# Patient Record
Sex: Female | Born: 1960 | Race: White | Hispanic: No | Marital: Married | Smoking: Never smoker
Health system: Southern US, Community
[De-identification: ages and names within clinical notes are randomized; demographics above are authoritative.]

## PROBLEM LIST (undated history)

## (undated) DIAGNOSIS — I890 Lymphedema, not elsewhere classified: Secondary | ICD-10-CM

## (undated) DIAGNOSIS — I517 Cardiomegaly: Secondary | ICD-10-CM

## (undated) DIAGNOSIS — I10 Essential (primary) hypertension: Secondary | ICD-10-CM

## (undated) DIAGNOSIS — R202 Paresthesia of skin: Secondary | ICD-10-CM

## (undated) DIAGNOSIS — E785 Hyperlipidemia, unspecified: Secondary | ICD-10-CM

## (undated) DIAGNOSIS — I5032 Chronic diastolic (congestive) heart failure: Secondary | ICD-10-CM

## (undated) DIAGNOSIS — M1990 Unspecified osteoarthritis, unspecified site: Secondary | ICD-10-CM

## (undated) DIAGNOSIS — Z87442 Personal history of urinary calculi: Secondary | ICD-10-CM

## (undated) DIAGNOSIS — I272 Pulmonary hypertension, unspecified: Secondary | ICD-10-CM

## (undated) DIAGNOSIS — G4733 Obstructive sleep apnea (adult) (pediatric): Secondary | ICD-10-CM

## (undated) DIAGNOSIS — E039 Hypothyroidism, unspecified: Secondary | ICD-10-CM

## (undated) HISTORY — DX: Pulmonary hypertension, unspecified: I27.20

## (undated) HISTORY — PX: BREAST SURGERY: SHX581

## (undated) HISTORY — DX: Obstructive sleep apnea (adult) (pediatric): G47.33

## (undated) HISTORY — DX: Hyperlipidemia, unspecified: E78.5

## (undated) HISTORY — DX: Cardiomegaly: I51.7

## (undated) HISTORY — DX: Chronic diastolic (congestive) heart failure: I50.32

## (undated) HISTORY — PX: OTHER SURGICAL HISTORY: SHX169

## (undated) HISTORY — DX: Paresthesia of skin: R20.2

## (undated) HISTORY — DX: Unspecified osteoarthritis, unspecified site: M19.90

---

## 1982-12-29 HISTORY — PX: TONSILLECTOMY: SUR1361

## 1997-12-29 HISTORY — PX: BACK SURGERY: SHX140

## 1998-10-01 ENCOUNTER — Encounter: Payer: Self-pay | Admitting: Neurosurgery

## 1998-10-01 ENCOUNTER — Inpatient Hospital Stay (HOSPITAL_COMMUNITY): Admission: RE | Admit: 1998-10-01 | Discharge: 1998-10-08 | Payer: Self-pay | Admitting: Neurosurgery

## 1998-11-05 ENCOUNTER — Inpatient Hospital Stay (HOSPITAL_COMMUNITY): Admission: AD | Admit: 1998-11-05 | Discharge: 1998-11-09 | Payer: Self-pay | Admitting: Neurosurgery

## 1998-11-05 ENCOUNTER — Encounter: Payer: Self-pay | Admitting: Neurosurgery

## 1998-11-14 ENCOUNTER — Ambulatory Visit (HOSPITAL_COMMUNITY): Admission: RE | Admit: 1998-11-14 | Discharge: 1998-11-14 | Payer: Self-pay | Admitting: Neurosurgery

## 1998-11-14 ENCOUNTER — Encounter: Payer: Self-pay | Admitting: Neurosurgery

## 1998-11-26 ENCOUNTER — Ambulatory Visit (HOSPITAL_COMMUNITY): Admission: RE | Admit: 1998-11-26 | Discharge: 1998-11-26 | Payer: Self-pay | Admitting: Neurosurgery

## 2007-12-30 HISTORY — PX: OTHER SURGICAL HISTORY: SHX169

## 2008-06-12 ENCOUNTER — Ambulatory Visit (HOSPITAL_COMMUNITY): Admission: RE | Admit: 2008-06-12 | Discharge: 2008-06-12 | Payer: Self-pay | Admitting: Surgery

## 2008-06-12 ENCOUNTER — Encounter: Admission: RE | Admit: 2008-06-12 | Discharge: 2008-07-31 | Payer: Self-pay | Admitting: Surgery

## 2008-06-13 ENCOUNTER — Ambulatory Visit (HOSPITAL_COMMUNITY): Admission: RE | Admit: 2008-06-13 | Discharge: 2008-06-13 | Payer: Self-pay | Admitting: Surgery

## 2008-08-28 ENCOUNTER — Ambulatory Visit (HOSPITAL_COMMUNITY): Admission: RE | Admit: 2008-08-28 | Discharge: 2008-08-29 | Payer: Self-pay | Admitting: Surgery

## 2008-09-08 ENCOUNTER — Encounter: Admission: RE | Admit: 2008-09-08 | Discharge: 2008-10-30 | Payer: Self-pay | Admitting: Surgery

## 2008-11-02 ENCOUNTER — Encounter: Admission: RE | Admit: 2008-11-02 | Discharge: 2008-11-02 | Payer: Self-pay | Admitting: Surgery

## 2009-01-17 ENCOUNTER — Encounter: Admission: RE | Admit: 2009-01-17 | Discharge: 2009-04-17 | Payer: Self-pay | Admitting: Surgery

## 2009-12-29 HISTORY — PX: DILATION AND CURETTAGE OF UTERUS: SHX78

## 2011-05-13 NOTE — Op Note (Signed)
Jocelyn Morris, Jocelyn Morris               ACCOUNT NO.:  [id number]   MEDICAL RECORD NO.:  [id number]          PATIENT TYPE:  OIB   LOCATION:  1535                         FACILITY:  [facility]   PHYSICIAN:  Thornton Park. Daphine Deutscher, MD  DATE OF BIRTH:  Feb 24, 1961   DATE OF PROCEDURE:  08/28/2008  DATE OF DISCHARGE:                               OPERATIVE REPORT   Monday, August 28, 2008   PREOPERATIVE DIAGNOSIS:  Morbid obesity, body mass index 59.   POSTOPERATIVE DIAGNOSIS:  Morbid obesity, no hiatal hernia seen.   PROCEDURE:  Laparoscopic adjustable gastric band (Allergan APL system).   SURGEON:  Luretha Murphy, MD.   ASSISTANT:  Baruch Merl, MD.   ANESTHESIA:  General endotracheal.   DESCRIPTION OF PROCEDURE:  Jocelyn Morris is a 50 year old white female  taken to Room 1 on Monday morning, August 28, 2008, and given general  anesthesia.  The abdomen was prepped with Techni-Care and draped  sterilely.  Access to the abdomen was achieved through the left upper  quadrant using an Optiview technique without difficulty.  Once the  abdomen was insufflated, the standard trocar placements were used,  including a 15 in the upper port on the right and an 11 down from that.  A 5 mm was placed superiorly through which a liver retractor was used.  The Optiview port in the left upper quadrant was used on the left.  Once  retracted, we dissected a place over on the left side for the finger to  come through and then went over on the right side and found a spot  beside the crus.  The banded finger was inserted from the lower port on  the right and came up through the fatty tissue on the left side.  Previously, the irrigation tubing had been inserted, the balloon had  been blown up and it did not show any evidence of a significant hiatal  hernia as it was trapped in the abdomen, it did not go up into the  chest.  I, therefore, did not disturb the diaphragm.  The band was  passed and engaged in the buckle and  then with tubing in place snapped  down.  It was then pulled over and I plicated it with four sutures.  There was one little serosal tear on the cardia that I went ahead and  repaired with a horizontal mattress suture using the Endo stitch and  Vicryl and then plicated the stomach over the band.  Appeared to be in  good position.  The tubing was brought through the lower incision on the  right side and I decided to go ahead and create a small subcutaneous  pocket because it was so deep down to her  fascia and I thought that would facilitate access.  Such a site was  created and then sewn around it with #4-0 Vicryl subcutaneously and then  subcuticularly, Benzoin and Steri-Strips on the skin.  The patient was  awakened and taken to the recovery room in satisfactory condition.      Thornton Park Daphine Deutscher, MD  Electronically Signed  MBM/MEDQ  D:  08/28/2008  T:  08/28/2008  Job:  [id number]   cc:   [facility]   Dr. Murray Hodgkins, [place]

## 2011-10-01 LAB — DIFFERENTIAL
Lymphocytes Relative: 22
Lymphs Abs: 2.3
Neutrophils Relative %: 72

## 2011-10-01 LAB — CBC
Platelets: 300
WBC: 10.6 — ABNORMAL HIGH

## 2012-03-02 ENCOUNTER — Telehealth (INDEPENDENT_AMBULATORY_CARE_PROVIDER_SITE_OTHER): Payer: Self-pay | Admitting: Surgery

## 2012-03-02 NOTE — Telephone Encounter (Signed)
03/02/12 mailed recall letter for bariatric surgery follow-up. Advised the patient to call [facility] @ [phone] to schedule an appointment...cef °

## 2013-04-07 ENCOUNTER — Telehealth (INDEPENDENT_AMBULATORY_CARE_PROVIDER_SITE_OTHER): Payer: Self-pay | Admitting: Surgery

## 2013-04-07 NOTE — Telephone Encounter (Signed)
04/07/13 lm and mailed recall letter for pt to schedule bariatric follow-up appt. Dr. Martin did lap band surgery 08/28/08. (lss) ° °

## 2013-04-26 ENCOUNTER — Other Ambulatory Visit: Payer: Self-pay | Admitting: Orthopedic Surgery

## 2013-04-26 ENCOUNTER — Encounter (HOSPITAL_COMMUNITY): Payer: Self-pay | Admitting: Pharmacy Technician

## 2013-04-26 MED ORDER — DEXAMETHASONE SODIUM PHOSPHATE 10 MG/ML IJ SOLN: 10.0000 mg | Freq: Once | INTRAMUSCULAR | Status: DC

## 2013-04-26 MED ORDER — BUPIVACAINE LIPOSOME 1.3 % IJ SUSP: 20.0000 mL | Freq: Once | INTRAMUSCULAR | Status: DC

## 2013-04-26 NOTE — Progress Notes (Signed)
Preoperative surgical orders have been place into the Epic hospital system for Ionia Schey on 04/26/2013, 5:28 PM  by Patrica Duel for surgery on 05/09/2013.  Preop Total Knee orders including Experal, IV Tylenol, and IV Decadron as long as there are no contraindications to the above medications. Avel Peace, PA-C

## 2013-04-26 NOTE — Progress Notes (Signed)
NEED PRE OP ORDERS PLEASE-  appt 04/29/13 PST  Thank you

## 2013-04-29 ENCOUNTER — Encounter (HOSPITAL_COMMUNITY): Payer: Self-pay

## 2013-04-29 ENCOUNTER — Encounter (HOSPITAL_COMMUNITY)
Admission: RE | Admit: 2013-04-29 | Discharge: 2013-04-29 | Disposition: A | Discharge status: Home / Self Care | Payer: BC Managed Care – PPO | Source: Ambulatory Visit | Attending: Orthopedic Surgery | Admitting: Orthopedic Surgery

## 2013-04-29 DIAGNOSIS — Z01812 Encounter for preprocedural laboratory examination: Secondary | ICD-10-CM | POA: Insufficient documentation

## 2013-04-29 DIAGNOSIS — Z0183 Encounter for blood typing: Secondary | ICD-10-CM | POA: Insufficient documentation

## 2013-04-29 DIAGNOSIS — M1710 Unilateral primary osteoarthritis, unspecified knee: Secondary | ICD-10-CM | POA: Insufficient documentation

## 2013-04-29 DIAGNOSIS — Z01810 Encounter for preprocedural cardiovascular examination: Secondary | ICD-10-CM | POA: Insufficient documentation

## 2013-04-29 HISTORY — DX: Personal history of urinary calculi: Z87.442

## 2013-04-29 HISTORY — DX: Hypothyroidism, unspecified: E03.9

## 2013-04-29 HISTORY — DX: Unspecified osteoarthritis, unspecified site: M19.90

## 2013-04-29 LAB — URINALYSIS, ROUTINE W REFLEX MICROSCOPIC
Bilirubin Urine: NEGATIVE
Glucose, UA: NEGATIVE mg/dL
Leukocytes, UA: NEGATIVE
Nitrite: NEGATIVE
Specific Gravity, Urine: 1.028 (ref 1.005–1.030)
pH: 7.5 (ref 5.0–8.0)

## 2013-04-29 LAB — COMPREHENSIVE METABOLIC PANEL
Albumin: 3.8 g/dL (ref 3.5–5.2)
Alkaline Phosphatase: 97 U/L (ref 39–117)
BUN: 14 mg/dL (ref 6–23)
CO2: 27 mEq/L (ref 19–32)
Chloride: 102 mEq/L (ref 96–112)
GFR calc non Af Amer: >90 mL/min (ref >90)
Glucose, Bld: 90 mg/dL (ref 70–99)
Potassium: 4.3 mEq/L (ref 3.5–5.1)
Total Bilirubin: 0.5 mg/dL (ref 0.3–1.2)

## 2013-04-29 LAB — CBC
Platelets: 357 10*3/uL (ref 150–400)
RBC: 4.49 MIL/uL (ref 3.87–5.11)
WBC: 7.2 10*3/uL (ref 4.0–10.5)

## 2013-04-29 LAB — ABO/RH: ABO/RH(D): O POS

## 2013-04-29 LAB — SURGICAL PCR SCREEN
MRSA, PCR: NEGATIVE
Staphylococcus aureus: NEGATIVE

## 2013-04-29 LAB — APTT: aPTT: 32 seconds (ref 24–37)

## 2013-04-29 LAB — PROTIME-INR: Prothrombin Time: 12.9 seconds (ref 11.6–15.2)

## 2013-04-29 NOTE — Patient Instructions (Signed)
YOUR SURGERY IS SCHEDULED AT [facility]  ON:  Monday  5/12  REPORT TO  SHORT STAY CENTER AT:  5:00 AM      PHONE # FOR SHORT STAY IS [phone]  DO NOT EAT OR DRINK ANYTHING AFTER MIDNIGHT THE NIGHT BEFORE YOUR SURGERY.  YOU MAY BRUSH YOUR TEETH, RINSE OUT YOUR MOUTH--BUT NO WATER, NO FOOD, NO CHEWING GUM, NO MINTS, NO CANDIES, NO CHEWING TOBACCO.  PLEASE TAKE THE FOLLOWING MEDICATIONS THE AM OF YOUR SURGERY WITH A FEW SIPS OF WATER:  LEVOTHYROXINE    DO NOT BRING VALUABLES, MONEY, CREDIT CARDS.  DO NOT WEAR JEWELRY, MAKE-UP, NAIL POLISH AND NO METAL PINS OR CLIPS IN YOUR HAIR. CONTACT LENS, DENTURES / PARTIALS, GLASSES SHOULD NOT BE WORN TO SURGERY AND IN MOST CASES-HEARING AIDS WILL NEED TO BE REMOVED.  BRING YOUR GLASSES CASE, ANY EQUIPMENT NEEDED FOR YOUR CONTACT LENS. FOR PATIENTS ADMITTED TO THE HOSPITAL--CHECK OUT TIME THE DAY OF DISCHARGE IS 11:00 AM.  ALL INPATIENT ROOMS ARE PRIVATE - WITH BATHROOM, TELEPHONE, TELEVISION AND WIFI INTERNET.                               PLEASE READ OVER ANY  FACT SHEETS THAT YOU WERE GIVEN: MRSA INFORMATION, BLOOD TRANSFUSION INFORMATION, INCENTIVE SPIROMETER INFORMATION. FAILURE TO FOLLOW THESE INSTRUCTIONS MAY RESULT IN THE CANCELLATION OF YOUR SURGERY.   PATIENT SIGNATURE_________________________________

## 2013-04-29 NOTE — Pre-Procedure Instructions (Signed)
PREOP EKG WAS DONE TODAY AT [facility] - PT'S B/P ELEVATED 155/93  SHE STATES SHE HAS NEVER BEEN DIAGNOSED WITH HYPERTENSION-HER BMI 51

## 2013-05-08 ENCOUNTER — Other Ambulatory Visit: Payer: Self-pay | Admitting: Surgical

## 2013-05-08 MED ORDER — VANCOMYCIN HCL 10 G IV SOLR
1500.0000 mg | INTRAVENOUS | Status: AC
  Administered 2013-05-09: 1500 mg via INTRAVENOUS
  Filled 2013-05-08: qty 1500

## 2013-05-08 NOTE — H&P (Signed)
TOTAL KNEE ADMISSION H&P  Patient is being admitted for left total knee arthroplasty.  Subjective:  Chief Complaint:left knee pain.  HPI: Jocelyn Morris, 52 y.o. female, has a history of pain and functional disability in the left knee due to trauma and arthritis and has failed non-surgical conservative treatments for greater than 12 weeks to includeNSAID's and/or analgesics, corticosteriod injections, viscosupplementation injections, weight reduction as appropriate and activity modification.  Onset of symptoms was abrupt, starting 2 years ago with gradually worsening course since that time. The patient noted prior procedures on the knee to include  arthroscopy and menisectomy on the left knee(s).  Patient currently rates pain in the left knee(s) at 7 out of 10 with activity. Patient has night pain, worsening of pain with activity and weight bearing, pain that interferes with activities of daily living, pain with passive range of motion, crepitus and joint swelling.  Patient has evidence of periarticular osteophytes and joint space narrowing by imaging studies. There is no active infection.  Past Medical History  Diagnosis Date  . Hypothyroidism   . History of kidney stones   . Arthritis     LEFT KNEE PAIN AND OA; S/P LUMBAR FUSION -- SOME BACK STIFFNESS-ESP AFTER LYING DOWN    Past Surgical History  Procedure Laterality Date  . Back surgery  1999    LUMBAR FUSION- 4 CAGES  . Lap band surgery  2009  . Dilation and curettage of uterus  2011  . Tonsillectomy  1984  . Ureteroscopy for stone removal       Current outpatient prescriptions: levothyroxine (SYNTHROID, LEVOTHROID) 150 MCG tablet, Take 150 mcg by mouth daily before breakfast., Disp: , Rfl: ;   polycarbophil (FIBERCON) 625 MG tablet, Take 625 mg by mouth daily., Disp: , Rfl:   Allergies  Allergen Reactions  . Penicillins Swelling and Rash    Swelling to throat    History  Substance Use Topics  . Smoking status: Never  Smoker   . Smokeless tobacco: Never Used  . Alcohol Use: No    Family History Father deceased age 35; DM type II, macular degeneration Mother deceased age 26 due to CVA  Review of Systems  Constitutional: Positive for weight loss. Negative for fever, chills, malaise/fatigue and diaphoresis.  HENT: Negative.  Negative for neck pain.   Eyes: Negative.   Respiratory: Negative.   Cardiovascular: Negative.   Gastrointestinal: Negative.   Genitourinary: Negative.   Musculoskeletal: Positive for joint pain. Negative for myalgias, back pain and falls.       Left knee pain  Skin: Negative.   Neurological: Negative.  Negative for weakness.  Endo/Heme/Allergies: Negative.   Psychiatric/Behavioral: Negative.     Objective:  Physical Exam  Constitutional: She is oriented to person, place, and time. She appears well-developed and well-nourished. No distress.  HENT:  Head: Normocephalic and atraumatic.  Right Ear: External ear normal.  Left Ear: External ear normal.  Nose: Nose normal.  Mouth/Throat: Oropharynx is clear and moist.  Eyes: Conjunctivae and EOM are normal.  Neck: Normal range of motion. Neck supple. No tracheal deviation present. No thyromegaly present.  Cardiovascular: Normal rate, regular rhythm, normal heart sounds and intact distal pulses.   No murmur heard. Respiratory: Effort normal and breath sounds normal. No respiratory distress. She has no wheezes. She exhibits no tenderness.  GI: Soft. Bowel sounds are normal. She exhibits no distension and no mass. There is no tenderness.  Musculoskeletal:       Right hip: Normal.  Left hip: Normal.       Right knee: Normal.       Left knee: She exhibits decreased range of motion and swelling. She exhibits no effusion and no erythema. Tenderness found. Medial joint line and lateral joint line tenderness noted.       Right lower leg: She exhibits no tenderness and no swelling.       Left lower leg: She exhibits no  tenderness and no swelling.  The range of motion in the left knee is about 10-120. There is marked crepitus on range of motion with tenderness medial greater than lateral with no instability.  Lymphadenopathy:    She has no cervical adenopathy.  Neurological: She is alert and oriented to person, place, and time. She has normal strength and normal reflexes. No sensory deficit.  Skin: No rash noted. She is not diaphoretic. No erythema.  Psychiatric: She has a normal mood and affect. Her behavior is normal.    Vitals Weight: 310 lb Height: 66 in Body Surface Area: 2.56 m Body Mass Index: 50.03 kg/m Pulse: 66 (Regular) BP: 124/76 (Sitting, Left Arm, Standard)    Imaging Review Plain radiographs demonstrate severe degenerative joint disease of the left knee(s). The overall alignment ismild varus. The bone quality appears to be good for age and reported activity level.  Assessment/Plan:  End stage arthritis, left knee   The patient history, physical examination, clinical judgment of the provider and imaging studies are consistent with end stage degenerative joint disease of the left knee(s) and total knee arthroplasty is deemed medically necessary. The treatment options including medical management, injection therapy arthroscopy and arthroplasty were discussed at length. The risks and benefits of total knee arthroplasty were presented and reviewed. The risks due to aseptic loosening, infection, stiffness, patella tracking problems, thromboembolic complications and other imponderables were discussed. The patient acknowledged the explanation, agreed to proceed with the plan and consent was signed. Patient is being admitted for inpatient treatment for surgery, pain control, PT, OT, prophylactic antibiotics, VTE prophylaxis, progressive ambulation and ADL's and discharge planning. The patient is planning to be discharged home with home health services    [place], [place]

## 2013-05-09 ENCOUNTER — Encounter (HOSPITAL_COMMUNITY)
Admission: RE | Disposition: A | Discharge status: Home Health | Payer: Self-pay | Source: Ambulatory Visit | Attending: Orthopedic Surgery

## 2013-05-09 ENCOUNTER — Ambulatory Visit (HOSPITAL_COMMUNITY): Payer: BC Managed Care – PPO | Admitting: Anesthesiology

## 2013-05-09 ENCOUNTER — Encounter (HOSPITAL_COMMUNITY): Payer: Self-pay | Admitting: *Deleted

## 2013-05-09 ENCOUNTER — Inpatient Hospital Stay (HOSPITAL_COMMUNITY)
Admission: RE | Admit: 2013-05-09 | Discharge: 2013-05-11 | DRG: 209 | Disposition: A | Discharge status: Home Health | Payer: BC Managed Care – PPO | Source: Ambulatory Visit | Attending: Orthopedic Surgery | Admitting: Orthopedic Surgery

## 2013-05-09 ENCOUNTER — Encounter (HOSPITAL_COMMUNITY): Payer: Self-pay | Admitting: Anesthesiology

## 2013-05-09 DIAGNOSIS — M179 Osteoarthritis of knee, unspecified: Secondary | ICD-10-CM | POA: Diagnosis present

## 2013-05-09 DIAGNOSIS — D62 Acute posthemorrhagic anemia: Secondary | ICD-10-CM | POA: Diagnosis present

## 2013-05-09 DIAGNOSIS — Z6841 Body Mass Index (BMI) 40.0 and over, adult: Secondary | ICD-10-CM

## 2013-05-09 DIAGNOSIS — E039 Hypothyroidism, unspecified: Secondary | ICD-10-CM | POA: Diagnosis present

## 2013-05-09 DIAGNOSIS — Z96652 Presence of left artificial knee joint: Secondary | ICD-10-CM

## 2013-05-09 DIAGNOSIS — M1710 Unilateral primary osteoarthritis, unspecified knee: Principal | ICD-10-CM | POA: Diagnosis present

## 2013-05-09 DIAGNOSIS — Z880 Allergy status to penicillin: Secondary | ICD-10-CM

## 2013-05-09 DIAGNOSIS — Z87442 Personal history of urinary calculi: Secondary | ICD-10-CM

## 2013-05-09 HISTORY — PX: TOTAL KNEE ARTHROPLASTY: SHX125

## 2013-05-09 LAB — TYPE AND SCREEN

## 2013-05-09 SURGERY — ARTHROPLASTY, KNEE, TOTAL
Anesthesia: General | Site: Knee | Laterality: Left | Wound class: Clean

## 2013-05-09 MED ORDER — TRAMADOL HCL 50 MG PO TABS: 50.0000 mg | ORAL_TABLET | Freq: Four times a day (QID) | ORAL | Status: DC | PRN

## 2013-05-09 MED ORDER — LACTATED RINGERS IV SOLN
INTRAVENOUS | Status: DC | PRN
  Administered 2013-05-09 (×2): via INTRAVENOUS

## 2013-05-09 MED ORDER — DIPHENHYDRAMINE HCL 12.5 MG/5ML PO ELIX: 12.5000 mg | ORAL_SOLUTION | ORAL | Status: DC | PRN

## 2013-05-09 MED ORDER — LIDOCAINE HCL (CARDIAC) 20 MG/ML IV SOLN
INTRAVENOUS | Status: DC | PRN
  Administered 2013-05-09: 100 mg via INTRAVENOUS

## 2013-05-09 MED ORDER — PHENOL 1.4 % MT LIQD
1.0000 | OROMUCOSAL | Status: DC | PRN
  Filled 2013-05-09: qty 177

## 2013-05-09 MED ORDER — DEXAMETHASONE 6 MG PO TABS
10.0000 mg | ORAL_TABLET | Freq: Every day | ORAL | Status: AC
  Administered 2013-05-10: 10 mg via ORAL
  Filled 2013-05-09: qty 1

## 2013-05-09 MED ORDER — BUPIVACAINE HCL 0.25 % IJ SOLN
INTRAMUSCULAR | Status: DC | PRN
  Administered 2013-05-09: 20 mL

## 2013-05-09 MED ORDER — FLEET ENEMA 7-19 GM/118ML RE ENEM: 1.0000 | ENEMA | Freq: Once | RECTAL | Status: AC | PRN

## 2013-05-09 MED ORDER — ONDANSETRON HCL 4 MG PO TABS: 4.0000 mg | ORAL_TABLET | Freq: Four times a day (QID) | ORAL | Status: DC | PRN

## 2013-05-09 MED ORDER — HYDROMORPHONE HCL PF 1 MG/ML IJ SOLN
INTRAMUSCULAR | Status: AC
  Filled 2013-05-09: qty 2

## 2013-05-09 MED ORDER — OXYCODONE HCL 5 MG PO TABS
5.0000 mg | ORAL_TABLET | ORAL | Status: DC | PRN
  Administered 2013-05-09 – 2013-05-11 (×15): 10 mg via ORAL
  Filled 2013-05-09 (×15): qty 2

## 2013-05-09 MED ORDER — ROCURONIUM BROMIDE 100 MG/10ML IV SOLN
INTRAVENOUS | Status: DC | PRN
  Administered 2013-05-09: 5 mg via INTRAVENOUS

## 2013-05-09 MED ORDER — PROPOFOL 10 MG/ML IV BOLUS
INTRAVENOUS | Status: DC | PRN
  Administered 2013-05-09: 240 mg via INTRAVENOUS

## 2013-05-09 MED ORDER — METHOCARBAMOL 100 MG/ML IJ SOLN: 500.0000 mg | Freq: Four times a day (QID) | INTRAVENOUS | Status: DC | PRN

## 2013-05-09 MED ORDER — TRANEXAMIC ACID 100 MG/ML IV SOLN: 1000.0000 mg | INTRAVENOUS | Status: DC

## 2013-05-09 MED ORDER — POLYETHYLENE GLYCOL 3350 17 G PO PACK: 17.0000 g | PACK | Freq: Every day | ORAL | Status: DC | PRN

## 2013-05-09 MED ORDER — PROPOFOL INFUSION 10 MG/ML OPTIME
INTRAVENOUS | Status: DC | PRN
  Administered 2013-05-09: 75 ug/kg/min via INTRAVENOUS

## 2013-05-09 MED ORDER — PROMETHAZINE HCL 25 MG/ML IJ SOLN: 6.2500 mg | INTRAMUSCULAR | Status: DC | PRN

## 2013-05-09 MED ORDER — DEXAMETHASONE SODIUM PHOSPHATE 10 MG/ML IJ SOLN
INTRAMUSCULAR | Status: DC | PRN
  Administered 2013-05-09: 10 mg via INTRAVENOUS

## 2013-05-09 MED ORDER — DEXAMETHASONE SODIUM PHOSPHATE 10 MG/ML IJ SOLN
10.0000 mg | Freq: Every day | INTRAMUSCULAR | Status: AC
  Filled 2013-05-09: qty 1

## 2013-05-09 MED ORDER — CALCIUM POLYCARBOPHIL 625 MG PO TABS
625.0000 mg | ORAL_TABLET | Freq: Every day | ORAL | Status: DC
  Administered 2013-05-10 – 2013-05-11 (×2): 625 mg via ORAL
  Filled 2013-05-09 (×2): qty 1

## 2013-05-09 MED ORDER — SODIUM CHLORIDE 0.9 % IV SOLN: INTRAVENOUS | Status: DC

## 2013-05-09 MED ORDER — DOCUSATE SODIUM 100 MG PO CAPS
100.0000 mg | ORAL_CAPSULE | Freq: Two times a day (BID) | ORAL | Status: DC
  Administered 2013-05-09 – 2013-05-11 (×4): 100 mg via ORAL

## 2013-05-09 MED ORDER — VANCOMYCIN HCL IN DEXTROSE 1-5 GM/200ML-% IV SOLN
INTRAVENOUS | Status: AC
  Filled 2013-05-09: qty 200

## 2013-05-09 MED ORDER — HYDROMORPHONE HCL PF 1 MG/ML IJ SOLN
INTRAMUSCULAR | Status: DC | PRN
  Administered 2013-05-09 (×2): 1 mg via INTRAVENOUS

## 2013-05-09 MED ORDER — 0.9 % SODIUM CHLORIDE (POUR BTL) OPTIME
TOPICAL | Status: DC | PRN
  Administered 2013-05-09: 1000 mL

## 2013-05-09 MED ORDER — HYDROMORPHONE HCL PF 1 MG/ML IJ SOLN
0.2500 mg | INTRAMUSCULAR | Status: DC | PRN
  Administered 2013-05-09: 0.5 mg via INTRAVENOUS

## 2013-05-09 MED ORDER — MIDAZOLAM HCL 5 MG/5ML IJ SOLN
INTRAMUSCULAR | Status: DC | PRN
  Administered 2013-05-09: 1 mg via INTRAVENOUS
  Administered 2013-05-09: 2 mg via INTRAVENOUS

## 2013-05-09 MED ORDER — METOCLOPRAMIDE HCL 5 MG/ML IJ SOLN
5.0000 mg | Freq: Three times a day (TID) | INTRAMUSCULAR | Status: DC | PRN
  Administered 2013-05-09: 10 mg via INTRAVENOUS
  Filled 2013-05-09: qty 2

## 2013-05-09 MED ORDER — VANCOMYCIN HCL 500 MG IV SOLR
INTRAVENOUS | Status: AC
  Filled 2013-05-09: qty 500

## 2013-05-09 MED ORDER — ONDANSETRON HCL 4 MG/2ML IJ SOLN
INTRAMUSCULAR | Status: DC | PRN
  Administered 2013-05-09: 4 mg via INTRAVENOUS

## 2013-05-09 MED ORDER — SODIUM CHLORIDE 0.9 % IJ SOLN
INTRAMUSCULAR | Status: DC | PRN
  Administered 2013-05-09: 30 mL via INTRAVENOUS

## 2013-05-09 MED ORDER — SUFENTANIL CITRATE 50 MCG/ML IV SOLN
INTRAVENOUS | Status: DC | PRN
  Administered 2013-05-09: 20 ug via INTRAVENOUS
  Administered 2013-05-09 (×4): 10 ug via INTRAVENOUS
  Administered 2013-05-09: 15 ug via INTRAVENOUS
  Administered 2013-05-09: 5 ug via INTRAVENOUS
  Administered 2013-05-09: 15 ug via INTRAVENOUS

## 2013-05-09 MED ORDER — TRANEXAMIC ACID 100 MG/ML IV SOLN
1000.0000 mg | INTRAVENOUS | Status: AC
  Administered 2013-05-09: 1000 mg via INTRAVENOUS
  Filled 2013-05-09: qty 10

## 2013-05-09 MED ORDER — CHLORHEXIDINE GLUCONATE 4 % EX LIQD
60.0000 mL | Freq: Once | CUTANEOUS | Status: DC
  Filled 2013-05-09: qty 60

## 2013-05-09 MED ORDER — SUCCINYLCHOLINE CHLORIDE 20 MG/ML IJ SOLN
INTRAMUSCULAR | Status: DC | PRN
  Administered 2013-05-09: 120 mg via INTRAVENOUS

## 2013-05-09 MED ORDER — STERILE WATER FOR IRRIGATION IR SOLN
Status: DC | PRN
  Administered 2013-05-09: 3000 mL

## 2013-05-09 MED ORDER — METHOCARBAMOL 500 MG PO TABS
500.0000 mg | ORAL_TABLET | Freq: Four times a day (QID) | ORAL | Status: DC | PRN
  Administered 2013-05-09 – 2013-05-11 (×8): 500 mg via ORAL
  Filled 2013-05-09 (×8): qty 1

## 2013-05-09 MED ORDER — MORPHINE SULFATE 2 MG/ML IJ SOLN
1.0000 mg | INTRAMUSCULAR | Status: DC | PRN
  Administered 2013-05-09: 1 mg via INTRAVENOUS
  Administered 2013-05-09 – 2013-05-10 (×3): 2 mg via INTRAVENOUS
  Filled 2013-05-09 (×4): qty 1

## 2013-05-09 MED ORDER — RIVAROXABAN 10 MG PO TABS
10.0000 mg | ORAL_TABLET | Freq: Every day | ORAL | Status: DC
  Administered 2013-05-10 – 2013-05-11 (×2): 10 mg via ORAL
  Filled 2013-05-09 (×3): qty 1

## 2013-05-09 MED ORDER — BISACODYL 10 MG RE SUPP: 10.0000 mg | Freq: Every day | RECTAL | Status: DC | PRN

## 2013-05-09 MED ORDER — BUPIVACAINE HCL (PF) 0.25 % IJ SOLN
INTRAMUSCULAR | Status: AC
  Filled 2013-05-09: qty 30

## 2013-05-09 MED ORDER — ACETAMINOPHEN 325 MG PO TABS: 650.0000 mg | ORAL_TABLET | Freq: Four times a day (QID) | ORAL | Status: DC | PRN

## 2013-05-09 MED ORDER — ACETAMINOPHEN 10 MG/ML IV SOLN: 1000.0000 mg | Freq: Once | INTRAVENOUS | Status: DC

## 2013-05-09 MED ORDER — ACETAMINOPHEN 650 MG RE SUPP: 650.0000 mg | Freq: Four times a day (QID) | RECTAL | Status: DC | PRN

## 2013-05-09 MED ORDER — LEVOTHYROXINE SODIUM 150 MCG PO TABS
150.0000 ug | ORAL_TABLET | Freq: Every day | ORAL | Status: DC
  Administered 2013-05-10 – 2013-05-11 (×2): 150 ug via ORAL
  Filled 2013-05-09 (×3): qty 1

## 2013-05-09 MED ORDER — ONDANSETRON HCL 4 MG/2ML IJ SOLN: 4.0000 mg | Freq: Four times a day (QID) | INTRAMUSCULAR | Status: DC | PRN

## 2013-05-09 MED ORDER — METOCLOPRAMIDE HCL 10 MG PO TABS: 5.0000 mg | ORAL_TABLET | Freq: Three times a day (TID) | ORAL | Status: DC | PRN

## 2013-05-09 MED ORDER — DEXTROSE-NACL 5-0.9 % IV SOLN
INTRAVENOUS | Status: DC
  Administered 2013-05-09: 10:00:00 via INTRAVENOUS

## 2013-05-09 MED ORDER — BUPIVACAINE LIPOSOME 1.3 % IJ SUSP
20.0000 mL | Freq: Once | INTRAMUSCULAR | Status: AC
  Administered 2013-05-09: 20 mL
  Filled 2013-05-09: qty 20

## 2013-05-09 MED ORDER — ACETAMINOPHEN 10 MG/ML IV SOLN
INTRAVENOUS | Status: AC
  Filled 2013-05-09: qty 100

## 2013-05-09 MED ORDER — SODIUM CHLORIDE 0.9 % IV SOLN
INTRAVENOUS | Status: AC
  Filled 2013-05-09: qty 100

## 2013-05-09 MED ORDER — ACETAMINOPHEN 10 MG/ML IV SOLN
INTRAVENOUS | Status: DC | PRN
  Administered 2013-05-09: 1000 mg via INTRAVENOUS

## 2013-05-09 MED ORDER — VANCOMYCIN HCL IN DEXTROSE 1-5 GM/200ML-% IV SOLN
1000.0000 mg | Freq: Two times a day (BID) | INTRAVENOUS | Status: AC
  Administered 2013-05-09: 1000 mg via INTRAVENOUS
  Filled 2013-05-09: qty 200

## 2013-05-09 MED ORDER — MENTHOL 3 MG MT LOZG
1.0000 | LOZENGE | OROMUCOSAL | Status: DC | PRN
  Filled 2013-05-09: qty 9

## 2013-05-09 MED ORDER — ACETAMINOPHEN 10 MG/ML IV SOLN
1000.0000 mg | Freq: Four times a day (QID) | INTRAVENOUS | Status: AC
  Administered 2013-05-09 – 2013-05-10 (×4): 1000 mg via INTRAVENOUS
  Filled 2013-05-09 (×6): qty 100

## 2013-05-09 SURGICAL SUPPLY — 53 items
BAG ZIPLOCK 12X15 (MISCELLANEOUS) ×2 IMPLANT
BANDAGE ELASTIC 6 VELCRO ST LF (GAUZE/BANDAGES/DRESSINGS) ×2 IMPLANT
BANDAGE ESMARK 6X9 LF (GAUZE/BANDAGES/DRESSINGS) ×1 IMPLANT
BLADE SAG 18X100X1.27 (BLADE) ×2 IMPLANT
BLADE SAW SGTL 11.0X1.19X90.0M (BLADE) ×2 IMPLANT
BNDG ESMARK 6X9 LF (GAUZE/BANDAGES/DRESSINGS) ×2
BOWL SMART MIX CTS (DISPOSABLE) ×2 IMPLANT
CEMENT HV SMART SET (Cement) ×4 IMPLANT
CLOTH BEACON ORANGE TIMEOUT ST (SAFETY) ×2 IMPLANT
CUFF TOURN SGL QUICK 44 (TOURNIQUET CUFF) ×2 IMPLANT
DECANTER SPIKE VIAL GLASS SM (MISCELLANEOUS) ×2 IMPLANT
DRAPE EXTREMITY T 121X128X90 (DRAPE) ×2 IMPLANT
DRAPE POUCH INSTRU U-SHP 10X18 (DRAPES) ×2 IMPLANT
DRAPE U-SHAPE 47X51 STRL (DRAPES) ×2 IMPLANT
DRSG ADAPTIC 3X8 NADH LF (GAUZE/BANDAGES/DRESSINGS) ×2 IMPLANT
DRSG PAD ABDOMINAL 8X10 ST (GAUZE/BANDAGES/DRESSINGS) ×2 IMPLANT
DURAPREP 26ML APPLICATOR (WOUND CARE) ×2 IMPLANT
ELECT REM PT RETURN 9FT ADLT (ELECTROSURGICAL) ×2
ELECTRODE REM PT RTRN 9FT ADLT (ELECTROSURGICAL) ×1 IMPLANT
EVACUATOR 1/8 PVC DRAIN (DRAIN) ×2 IMPLANT
FACESHIELD LNG OPTICON STERILE (SAFETY) ×10 IMPLANT
GLOVE BIO SURGEON STRL SZ7.5 (GLOVE) IMPLANT
GLOVE BIO SURGEON STRL SZ8 (GLOVE) ×2 IMPLANT
GLOVE BIOGEL PI IND STRL 8 (GLOVE) ×1 IMPLANT
GLOVE BIOGEL PI INDICATOR 8 (GLOVE) ×1
GLOVE SURG SS PI 6.5 STRL IVOR (GLOVE) IMPLANT
GOWN STRL NON-REIN LRG LVL3 (GOWN DISPOSABLE) ×2 IMPLANT
GOWN STRL REIN XL XLG (GOWN DISPOSABLE) IMPLANT
HANDPIECE INTERPULSE COAX TIP (DISPOSABLE) ×1
IMMOBILIZER KNEE 22 UNIV (SOFTGOODS) ×2 IMPLANT
KIT BASIN OR (CUSTOM PROCEDURE TRAY) ×2 IMPLANT
MANIFOLD NEPTUNE II (INSTRUMENTS) ×2 IMPLANT
NDL SAFETY ECLIPSE 18X1.5 (NEEDLE) ×2 IMPLANT
NEEDLE HYPO 18GX1.5 SHARP (NEEDLE) ×2
NS IRRIG 1000ML POUR BTL (IV SOLUTION) ×2 IMPLANT
PACK TOTAL JOINT (CUSTOM PROCEDURE TRAY) ×2 IMPLANT
PAD ABD 7.5X8 STRL (GAUZE/BANDAGES/DRESSINGS) ×2 IMPLANT
PADDING CAST COTTON 6X4 STRL (CAST SUPPLIES) ×2 IMPLANT
POSITIONER SURGICAL ARM (MISCELLANEOUS) ×2 IMPLANT
SET HNDPC FAN SPRY TIP SCT (DISPOSABLE) ×1 IMPLANT
SPONGE GAUZE 4X4 12PLY (GAUZE/BANDAGES/DRESSINGS) ×2 IMPLANT
STRIP CLOSURE SKIN 1/2X4 (GAUZE/BANDAGES/DRESSINGS) ×2 IMPLANT
SUCTION FRAZIER 12FR DISP (SUCTIONS) ×2 IMPLANT
SUT MNCRL AB 4-0 PS2 18 (SUTURE) ×2 IMPLANT
SUT VIC AB 2-0 CT1 27 (SUTURE) ×3
SUT VIC AB 2-0 CT1 TAPERPNT 27 (SUTURE) ×3 IMPLANT
SUT VLOC 180 0 24IN GS25 (SUTURE) ×2 IMPLANT
SYR 20CC LL (SYRINGE) ×2 IMPLANT
SYR 50ML LL SCALE MARK (SYRINGE) ×2 IMPLANT
TOWEL OR 17X26 10 PK STRL BLUE (TOWEL DISPOSABLE) ×4 IMPLANT
TRAY FOLEY CATH 14FRSI W/METER (CATHETERS) ×2 IMPLANT
WATER STERILE IRR 1500ML POUR (IV SOLUTION) ×4 IMPLANT
WRAP KNEE MAXI GEL POST OP (GAUZE/BANDAGES/DRESSINGS) ×2 IMPLANT

## 2013-05-09 NOTE — Anesthesia Postprocedure Evaluation (Signed)
  Anesthesia Post-op Note  Patient: Jocelyn Morris  Procedure(s) Performed: Procedure(s) (LRB): LEFT TOTAL KNEE ARTHROPLASTY (Left)  Patient Location: PACU  Anesthesia Type: General  Level of Consciousness: awake and alert   Airway and Oxygen Therapy: Patient Spontanous Breathing  Post-op Pain: mild  Post-op Assessment: Post-op Vital signs reviewed, Patient's Cardiovascular Status Stable, Respiratory Function Stable, Patent Airway and No signs of Nausea or vomiting  Last Vitals:  Filed Vitals:   05/09/13 0940  BP:   Pulse: 52  Temp:   Resp: 11    Post-op Vital Signs: stable   Complications: No apparent anesthesia complications

## 2013-05-09 NOTE — Evaluation (Signed)
Physical Therapy Evaluation Patient Details Name: Jocelyn Morris MRN: [id number] DOB: [birth year] Today's Date: 05/09/2013 Time: 4696-2952 PT Time Calculation (min): 21 min  PT Assessment / Plan / Recommendation Clinical Impression  Pt is a 52 year old female s/p L TKR.  Pt would benefit from acute PT services in order to improve independence with transfers, ambulation and stairs by increasing L knee strength and ROM to prepare for d/c home with spouse.    PT Assessment  Patient needs continued PT services    Follow Up Recommendations  Home health PT    Does the patient have the potential to tolerate intense rehabilitation      Barriers to Discharge        Equipment Recommendations  Rolling walker with 5" wheels (?bari)    Recommendations for Other Services     Frequency 7X/week    Precautions / Restrictions Precautions Precautions: Knee Required Braces or Orthoses: Knee Immobilizer - Left Restrictions LLE Weight Bearing: Weight bearing as tolerated   Pertinent Vitals/Pain Premedicated for therapy, repositioned to comfort in recliner, ice applied      Mobility  Bed Mobility Bed Mobility: Supine to Sit Supine to Sit: HOB elevated;With rails;4: Min assist Details for Bed Mobility Assistance: assist for L LE Transfers Transfers: Sit to Stand;Stand to Sit Sit to Stand: 4: Min assist;With upper extremity assist;From bed Stand to Sit: 4: Min assist;With upper extremity assist;To chair/3-in-1 Details for Transfer Assistance: verbal cues for safe technique Ambulation/Gait Ambulation/Gait Assistance: 4: Min guard Ambulation Distance (Feet): 40 Feet Assistive device: Rolling walker Ambulation/Gait Assistance Details: verbal cues for RW distance, sequence, technique, pt reports no dizzines, minimal pain Gait Pattern: Step-to pattern;Trunk flexed Gait velocity: decreased    Exercises     PT Diagnosis: Difficulty walking  PT Problem List: Decreased strength;Decreased  activity tolerance;Decreased range of motion;Decreased mobility;Decreased knowledge of use of DME;Decreased knowledge of precautions;Obesity PT Treatment Interventions: Gait training;DME instruction;Stair training;Functional mobility training;Therapeutic activities;Therapeutic exercise;Patient/family education   PT Goals Acute Rehab PT Goals PT Goal Formulation: With patient Time For Goal Achievement: 05/16/13 Potential to Achieve Goals: Good Pt will go Supine/Side to Sit: with modified independence PT Goal: Supine/Side to Sit - Progress: Goal set today Pt will go Sit to Stand: with modified independence PT Goal: Sit to Stand - Progress: Goal set today Pt will go Stand to Sit: with modified independence PT Goal: Stand to Sit - Progress: Goal set today Pt will Ambulate: 51 - 150 feet;with modified independence;with least restrictive assistive device PT Goal: Ambulate - Progress: Goal set today Pt will Go Up / Down Stairs: 1-2 stairs;with supervision;with least restrictive assistive device PT Goal: Up/Down Stairs - Progress: Goal set today Pt will Perform Home Exercise Program: with supervision, verbal cues required/provided PT Goal: Perform Home Exercise Program - Progress: Goal set today  Visit Information  Last PT Received On: 05/09/13 Assistance Needed: +1    Subjective Data  Subjective: I think I can get up. Lets try.   Prior Functioning  Home Living Lives With: Spouse Type of Home: House Home Access: Stairs to enter Secretary/administrator of Steps: 1 and 1 Entrance Stairs-Rails: None Home Layout: One level Home Adaptive Equipment: None Prior Function Level of Independence: Independent Communication Communication: No difficulties    Cognition  Cognition Arousal/Alertness: Awake/alert Behavior During Therapy: WFL for tasks assessed/performed Overall Cognitive Status: Within Functional Limits for tasks assessed    Extremity/Trunk Assessment Right Lower Extremity  Assessment RLE ROM/Strength/Tone: [facility] for tasks assessed RLE Sensation: WFL -  Light Touch Left Lower Extremity Assessment LLE ROM/Strength/Tone: Deficits LLE ROM/Strength/Tone Deficits: L knee active assist ROM -2-30* supine, good quad set however unable to perform SLR LLE Sensation: WFL - Light Touch   Balance    End of Session PT - End of Session Equipment Utilized During Treatment: Gait belt;Left knee immobilizer Activity Tolerance: Patient tolerated treatment well Patient left: in chair;with call bell/phone within reach;with family/visitor present CPM Left Knee CPM Left Knee: Off  GP     Anylah Scheib,KATHrine E 05/09/2013, 3:53 PM Zenovia Jarred, PT, DPT 05/09/2013 Pager: [phone]

## 2013-05-09 NOTE — Care Management Note (Signed)
    Page 1 of 2   05/11/2013     11:30:30 AM   CARE MANAGEMENT NOTE 05/11/2013  Patient:  Jocelyn Morris, Jocelyn Morris   Account Number:  [id number]  Date Initiated:  05/09/2013  Documentation initiated by:  Colleen Can  Subjective/Objective Assessment:   dx total knee replacemnt -left     Action/Plan:   CM spoke with patient and spouse. Plans are for patient to return to her home in [place] where spouse will be caregiver. States she will need RW and wants to use [facility] agency in network.   Anticipated DC Date:  05/12/2013   Anticipated DC Plan:  HOME W HOME HEALTH SERVICES      DC Planning Services  CM consult      PAC Choice  DURABLE MEDICAL EQUIPMENT  HOME HEALTH   Choice offered to / List presented to:  C-1 Patient   DME arranged  WALKER - ROLLING  3-N-1      DME agency  [facility]     HH arranged  HH-2 PT      [facility] agency  [facility]   Status of service:  Completed, signed off Medicare Important Message given?   (If response is "NO", the following Medicare IM given date fields will be blank) Date Medicare IM given:   Date Additional Medicare IM given:    Discharge Disposition:  HOME W HOME HEALTH SERVICES  Per UR Regulation:  Reviewed for med. necessity/level of care/duration of stay  If discussed at Long Length of Stay Meetings, dates discussed:    Comments:  05/11/2013 Colleen Can [phone] Pt discharged today with [facility] in place to provide HHpt services. Start date tomorrow 05/12/2013.   05/09/2013 Colleen Can BSN RN CCM [phone] [facility] of services request and [facility]. CM will follow for [facility] and dme orders.

## 2013-05-09 NOTE — Progress Notes (Signed)
UR completed 

## 2013-05-09 NOTE — Plan of Care (Signed)
Problem: Consults Goal: Diagnosis- Total Joint Replacement Left total knee     

## 2013-05-09 NOTE — Transfer of Care (Signed)
Immediate Anesthesia Transfer of Care Note  Patient: Jocelyn Morris  Procedure(s) Performed: Procedure(s): LEFT TOTAL KNEE ARTHROPLASTY (Left)  Patient Location: PACU  Anesthesia Type:General  Level of Consciousness: awake, alert , oriented and patient cooperative  Airway & Oxygen Therapy: Patient Spontanous Breathing and Patient connected to face mask oxygen  Post-op Assessment: Report given to PACU RN, Post -op Vital signs reviewed and stable and Patient moving all extremities X 4  Post vital signs: stable  Complications: No apparent anesthesia complications

## 2013-05-09 NOTE — Op Note (Signed)
Pre-operative diagnosis- Osteoarthritis  Left knee(s)  Post-operative diagnosis- Osteoarthritis Left knee(s)  Procedure-  Left  Total Knee Arthroplasty  Surgeon- Gus Rankin. Aysiah Jurado, MD  Assistant- Dimitri Ped, PA-C   Anesthesia-  General EBL- 300  Drains Hemovac  Tourniquet time-  Total Tourniquet Time Documented: Thigh (Left) - 1 minutes Total: Thigh (Left) - 1 minutes    Complications- None  Condition-PACU - hemodynamically stable.   Brief Clinical Note  Jocelyn Morris is a 52 y.o. year old female with end stage OA of her left knee with progressively worsening pain and dysfunction. She has constant pain, with activity and at rest and significant functional deficits with difficulties even with ADLs. She has had extensive non-op management including analgesics, injections of cortisone and viscosupplements, and home exercise program, but remains in significant pain with significant dysfunction. Radiographs show bone on bone arthritis medial and patellofemoral. She presents now for left Total Knee Arthroplasty.    Procedure in detail---   The patient is brought into the operating room and positioned supine on the operating table. After successful administration of  General,   a tourniquet is placed high on the  Left thigh(s) and the lower extremity is prepped and draped in the usual sterile fashion. Time out is performed by the operating team and then the  Left lower extremity is wrapped in Esmarch, knee flexed and the tourniquet inflated to 300 mmHg.       A midline incision is made with a ten blade through the subcutaneous tissue to the level of the extensor mechanism. A fresh blade is used to make a medial parapatellar arthrotomy. At this point it is obvious that we have a venous tourniquet and thus the tourniquet was released. Soft tissue over the proximal medial tibia is subperiosteally elevated to the joint line with a knife and into the semimembranosus bursa with a Cobb elevator.  Soft tissue over the proximal lateral tibia is elevated with attention being paid to avoiding the patellar tendon on the tibial tubercle. The patella is everted, knee flexed 90 degrees and the ACL and PCL are removed. Findings are bone on bone medial and patellofemoral with large osteophytes.        The drill is used to create a starting hole in the distal femur and the canal is thoroughly irrigated with sterile saline to remove the fatty contents. The 5 degree Left  valgus alignment guide is placed into the femoral canal and the distal femoral cutting block is pinned to remove 10 mm off the distal femur. Resection is made with an oscillating saw.      The tibia is subluxed forward and the menisci are removed. The extramedullary alignment guide is placed referencing proximally at the medial aspect of the tibial tubercle and distally along the second metatarsal axis and tibial crest. The block is pinned to remove 2mm off the more deficient medial  side. Resection is made with an oscillating saw. Size 3is the most appropriate size for the tibia and the proximal tibia is prepared with the modular drill and keel punch for that size.      The femoral sizing guide is placed and size 4  Narrow is most appropriate. Rotation is marked off the epicondylar axis and confirmed by creating a rectangular flexion gap at 90 degrees. The size 4 cutting block is pinned in this rotation and the anterior, posterior and chamfer cuts are made with the oscillating saw. The intercondylar block is then placed and that cut is  made.      Trial size 3 tibial component, trial size 4 narrow posterior stabilized femur and a 12.5  mm posterior stabilized rotating platform insert trial is placed. Full extension is achieved with excellent varus/valgus and anterior/posterior balance throughout full range of motion. The patella is everted and thickness measured to be 24  mm. Free hand resection is taken to 14 mm, a 38 template is placed, lug holes  are drilled, trial patella is placed, and it tracks normally. Osteophytes are removed off the posterior femur with the trial in place. All trials are removed and the cut bone surfaces prepared with pulsatile lavage. Cement is mixed and once ready for implantation, the size 3 tibial implant, size  4 narrow posterior stabilized femoral component, and the size 38 patella are cemented in place and the patella is held with the clamp. The trial insert is placed and the knee held in full extension. The Exparel (20 ml mixed with 30 ml saline) and .25% Bupivicaine, are injected into the extensor mechanism, posterior capsule, medial and lateral gutters and subcutaneous tissues.  All extruded cement is removed and once the cement is hard the permanent 12.5 mm posterior stabilized rotating platform insert is placed into the tibial tray.      The wound is copiously irrigated with saline solution and the extensor mechanism closed over a hemovac drain with #1 V-loc suture.Flexion against gravity is 135 degrees and the patella tracks normally. Subcutaneous tissue is closed with 2.0 vicryl and subcuticular with running 4.0 Monocryl. The incision is cleaned and dried and steri-strips and a bulky sterile dressing are applied. The limb is placed into a knee immobilizer and the patient is awakened and transported to recovery in stable condition.      Please note that a surgical assistant was a medical necessity for this procedure in order to perform it in a safe and expeditious manner. Surgical assistant was necessary to retract the ligaments and vital neurovascular structures to prevent injury to them and also necessary for proper positioning of the limb to allow for anatomic placement of the prosthesis.   Gus Rankin Jocelyn Larsh, MD    05/09/2013, 8:27 AM

## 2013-05-09 NOTE — Interval H&P Note (Signed)
History and Physical Interval Note:  05/09/2013 6:45 AM  Jocelyn Morris  has presented today for surgery, with the diagnosis of OSTEOARTHRITIS LEFT KNEE  The various methods of treatment have been discussed with the patient and family. After consideration of risks, benefits and other options for treatment, the patient has consented to  Procedure(s): LEFT TOTAL KNEE ARTHROPLASTY (Left) as a surgical intervention .  The patient's history has been reviewed, patient examined, no change in status, stable for surgery.  I have reviewed the patient's chart and labs.  Questions were answered to the patient's satisfaction.     Loanne Drilling

## 2013-05-09 NOTE — Anesthesia Preprocedure Evaluation (Addendum)
Anesthesia Evaluation  Patient identified by MRN, date of birth, ID band Patient awake  General Assessment Comment:.  Hypothyroidism     .  History of kidney stones     .  Arthritis         LEFT KNEE PAIN AND OA; S/P LUMBAR FUSION -- SOME BACK STIFFNESS-ESP AFTER LYING DOWN         Reviewed: Allergy & Precautions, H&P , NPO status , Patient's Chart, lab work & pertinent test results, reviewed documented beta blocker date and time   Airway Mallampati: II TM Distance: >3 FB Neck ROM: full    Dental no notable dental hx.    Pulmonary neg pulmonary ROS,  breath sounds clear to auscultation  Pulmonary exam normal       Cardiovascular Exercise Tolerance: Good negative cardio ROS  Rhythm:regular Rate:Normal  ECG: SB 58 possible LAE.   Neuro/Psych negative neurological ROS  negative psych ROS   GI/Hepatic negative GI ROS, Neg liver ROS,   Endo/Other  Hypothyroidism Morbid obesity  Renal/GU negative Renal ROS  negative genitourinary   Musculoskeletal   Abdominal (+) + obese,   Peds  Hematology negative hematology ROS (+)   Anesthesia Other Findings   Reproductive/Obstetrics Negative pregnancy test 04-29-13                          Anesthesia Physical Anesthesia Plan  ASA: III  Anesthesia Plan: General   Post-op Pain Management:    Induction: Intravenous  Airway Management Planned: Oral ETT  Additional Equipment:   Intra-op Plan:   Post-operative Plan: Extubation in OR  Informed Consent: I have reviewed the patients History and Physical, chart, labs and discussed the procedure including the risks, benefits and alternatives for the proposed anesthesia with the patient or authorized representative who has indicated his/her understanding and acceptance.   Dental Advisory Given  Plan Discussed with: CRNA  Anesthesia Plan Comments: (Discussed general versus spinal. She has had ray cages  placed in back and she prefers general.)       Anesthesia Quick Evaluation

## 2013-05-10 ENCOUNTER — Encounter (HOSPITAL_COMMUNITY): Payer: Self-pay | Admitting: Orthopedic Surgery

## 2013-05-10 DIAGNOSIS — D62 Acute posthemorrhagic anemia: Secondary | ICD-10-CM

## 2013-05-10 LAB — CBC
HCT: 33.5 % — ABNORMAL LOW (ref 36.0–46.0)
MCV: 90.8 fL (ref 78.0–100.0)
RBC: 3.69 MIL/uL — ABNORMAL LOW (ref 3.87–5.11)
RDW: 12.7 % (ref 11.5–15.5)
WBC: 13.6 10*3/uL — ABNORMAL HIGH (ref 4.0–10.5)

## 2013-05-10 LAB — BASIC METABOLIC PANEL
BUN: 10 mg/dL (ref 6–23)
CO2: 28 mEq/L (ref 19–32)
Chloride: 104 mEq/L (ref 96–112)
Creatinine, Ser: 0.66 mg/dL (ref 0.50–1.10)

## 2013-05-10 MED ORDER — METHOCARBAMOL 500 MG PO TABS: 500.0000 mg | ORAL_TABLET | Freq: Four times a day (QID) | ORAL | Status: DC | PRN

## 2013-05-10 MED ORDER — TRAMADOL HCL 50 MG PO TABS: 50.0000 mg | ORAL_TABLET | Freq: Four times a day (QID) | ORAL | Status: AC | PRN

## 2013-05-10 MED ORDER — RIVAROXABAN 10 MG PO TABS: 10.0000 mg | ORAL_TABLET | Freq: Every day | ORAL | Status: DC

## 2013-05-10 MED ORDER — OXYCODONE HCL 5 MG PO TABS: 5.0000 mg | ORAL_TABLET | ORAL | Status: DC | PRN

## 2013-05-10 NOTE — Progress Notes (Signed)
Received orders for rw and wide commode.  RW will be delivered to the hospital and commode will be delivered to the home.

## 2013-05-10 NOTE — Evaluation (Signed)
Occupational Therapy Evaluation Patient Details Name: Jocelyn Morris MRN: [id number] DOB: July 28, 1961 Today's Date: 05/10/2013 Time: 7829-5621 OT Time Calculation (min): 21 min  OT Assessment / Plan / Recommendation Clinical Impression  This 51 year old female was admitted for L TKA.  All education was completed.  No further OT is needed at this time.      OT Assessment  Patient does not need any further OT services    Follow Up Recommendations  No OT follow up    Barriers to Discharge      Equipment Recommendations  3 in 1 bedside comode (wide)    Recommendations for Other Services    Frequency       Precautions / Restrictions Precautions Precautions: Knee Required Braces or Orthoses: Knee Immobilizer - Left Restrictions LLE Weight Bearing: Weight bearing as tolerated   Pertinent Vitals/Pain None at rest.  5/10 with weight bearing.  Repositioned with ice; rn alerted.  Pt was premedicated    ADL  Grooming: Supervision/safety Where Assessed - Grooming: Supported standing Lower Body Bathing: Minimal assistance Where Assessed - Lower Body Bathing: Supported sit to stand Lower Body Dressing: Moderate assistance Where Assessed - Lower Body Dressing: Supported sit to stand Toilet Transfer: Counsellor Method: Sit to Barista:  (bed to sink to recliner) Transfers/Ambulation Related to ADLs: ambulated to sink with min guard assist.  Pt moves well---pain 5/10 with weight bearing.   ADL Comments: Educated on safety:  releasing only one hand at a time in standing.  Discussed tub readiness.  Pt will sponge bathe initially.  She will have 24/7 for a week.      OT Diagnosis:    OT Problem List:   OT Treatment Interventions:     OT Goals    Visit Information  Last OT Received On: 05/10/13 Assistance Needed: +1    Subjective Data  Subjective: I had back surgery, and I used to have something that went over the commode Patient  Stated Goal: get rid of pain and get back to being independent.   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) Communication Communication: No difficulties         Vision/Perception     Copywriter, advertising Arousal/Alertness: Awake/alert Behavior During Therapy: WFL for tasks assessed/performed Overall Cognitive Status: Within Functional Limits for tasks assessed    Extremity/Trunk Assessment Right Upper Extremity Assessment RUE ROM/Strength/Tone: Within functional levels Left Upper Extremity Assessment LUE ROM/Strength/Tone: Within functional levels     Mobility Bed Mobility Bed Mobility: Supine to Sit Supine to Sit: HOB elevated;With rails;4: Min assist Details for Bed Mobility Assistance: assist for L LE Transfers Sit to Stand: 4: Min guard;From bed;With armrests;From elevated surface Details for Transfer Assistance: vcs for technique     Exercise     Balance     End of Session OT - End of Session Activity Tolerance: Patient tolerated treatment well Patient left: in chair;with call bell/phone within reach  GO     [facility] 05/10/2013, 8:42 AM Jocelyn Morris, OTR/L [phone] 05/10/2013

## 2013-05-10 NOTE — Progress Notes (Signed)
Physical Therapy Treatment Patient Details Name: Jocelyn Morris MRN: [id number] DOB: 05/30/1961 Today's Date: 05/10/2013 Time: 4098-1191 PT Time Calculation (min): 15 min  PT Assessment / Plan / Recommendation Comments on Treatment Session  Pt ambulated in hallway, plans to DC to home tomorrow. progressing well.    Follow Up Recommendations  Home health PT     Does the patient have the potential to tolerate intense rehabilitation     Barriers to Discharge        Equipment Recommendations  Rolling walker with 5" wheels    Recommendations for Other Services    Frequency 7X/week   Plan Discharge plan remains appropriate;Frequency remains appropriate    Precautions / Restrictions Precautions Precautions: Knee Required Braces or Orthoses: Knee Immobilizer - Left   Pertinent Vitals/Pain 6 just got medication for L knee pain. Ice applied.    Mobility  Bed Mobility Bed Mobility: Sit to Supine Sit to Supine: 4: Min assist Details for Bed Mobility Assistance: assist for L LE onto bed. Transfers Sit to Stand: From elevated surface;With upper extremity assist;From chair/3-in-1;5: Supervision Stand to Sit: To bed;With upper extremity assist;5: Supervision Details for Transfer Assistance: verbal cues for technique Ambulation/Gait Ambulation/Gait Assistance: 4: Min guard;5: Supervision Ambulation Distance (Feet): 160 Feet Assistive device: Rolling walker Gait Pattern: Step-to pattern;Trunk flexed Gait velocity: decreased    Exercises     PT Diagnosis:    PT Problem List:   PT Treatment Interventions:     PT Goals Acute Rehab PT Goals Pt will go Sit to Stand: with modified independence PT Goal: Sit to Stand - Progress: Progressing toward goal Pt will go Stand to Sit: with modified independence PT Goal: Stand to Sit - Progress: Progressing toward goal Pt will Ambulate: 51 - 150 feet;with modified independence;with least restrictive assistive device PT Goal: Ambulate -  Progress: Progressing toward goal  Visit Information  Last PT Received On: 05/10/13 Assistance Needed: +1    Subjective Data  Subjective: It feels good walking.   Cognition  Cognition Arousal/Alertness: Awake/alert    Balance     End of Session PT - End of Session Equipment Utilized During Treatment: Left knee immobilizer Activity Tolerance: Patient tolerated treatment well Patient left: in bed;with call bell/phone within reach Nurse Communication: Mobility status   GP     Rada Hay 05/10/2013, 4:21 PM

## 2013-05-10 NOTE — Progress Notes (Signed)
Physical Therapy Treatment Patient Details Name: Jocelyn Morris MRN: [id number] DOB: Sep 09, 1961 Today's Date: 05/10/2013 Time: 4098-1191 PT Time Calculation (min): 25 min  PT Assessment / Plan / Recommendation Comments on Treatment Session  Pt ambulated in hallway and performed exercises.  Progressing well.  Ice packs applied.  Pt would benefit from wide RW upon d/c.    Follow Up Recommendations  Home health PT     Does the patient have the potential to tolerate intense rehabilitation     Barriers to Discharge        Equipment Recommendations  Rolling walker with 5" wheels (wide)    Recommendations for Other Services    Frequency     Plan Discharge plan remains appropriate;Frequency remains appropriate    Precautions / Restrictions Precautions Precautions: Knee Required Braces or Orthoses: Knee Immobilizer - Left Restrictions LLE Weight Bearing: Weight bearing as tolerated   Pertinent Vitals/Pain Premedicated, ice applied    Mobility  Bed Mobility Bed Mobility: Not assessed Supine to Sit: HOB elevated;With rails;4: Min assist Details for Bed Mobility Assistance: assist for L LE Transfers Transfers: Sit to Stand;Stand to Sit Sit to Stand: From elevated surface;With upper extremity assist;From chair/3-in-1 Stand to Sit: 4: Min guard;With upper extremity assist;To chair/3-in-1 Details for Transfer Assistance: verbal cues for technique Ambulation/Gait Ambulation/Gait Assistance: 4: Min guard;5: Supervision Ambulation Distance (Feet): 160 Feet Assistive device: Rolling walker Ambulation/Gait Assistance Details: verbal cues for RW distance adn posture Gait Pattern: Step-to pattern;Trunk flexed Gait velocity: decreased    Exercises Total Joint Exercises Ankle Circles/Pumps: AROM;Both;15 reps Quad Sets: AROM;Both;10 reps Gluteal Sets: AROM;Both;10 reps Short Arc QuadBarbaraann Morris;Left;10 reps Heel Slides: AROM;Left;10 reps;Seated Hip ABduction/ADduction: 10  reps;AAROM;Left Goniometric ROM: -4-50* sitting edge of chair   PT Diagnosis:    PT Problem List:   PT Treatment Interventions:     PT Goals Acute Rehab PT Goals PT Goal: Sit to Stand - Progress: Progressing toward goal PT Goal: Stand to Sit - Progress: Progressing toward goal PT Goal: Ambulate - Progress: Progressing toward goal PT Goal: Perform Home Exercise Program - Progress: Progressing toward goal  Visit Information  Last PT Received On: 05/10/13 Assistance Needed: +1    Subjective Data  Subjective: It feels good walking.   Cognition  Cognition Arousal/Alertness: Awake/alert Behavior During Therapy: WFL for tasks assessed/performed Overall Cognitive Status: Within Functional Limits for tasks assessed    Balance     End of Session PT - End of Session Equipment Utilized During Treatment: Left knee immobilizer Activity Tolerance: Patient limited by pain Patient left: in chair;with call bell/phone within reach   GP     Jocelyn Morris,Jocelyn Morris 05/10/2013, 10:34 AM Jocelyn Morris, PT, DPT 05/10/2013 Pager: [phone]

## 2013-05-10 NOTE — Progress Notes (Signed)
   Subjective: 1 Day Post-Op Procedure(s) (LRB): LEFT TOTAL KNEE ARTHROPLASTY (Left) Patient reports pain as mild and moderate.   Patient seen in rounds with Dr. Lequita Halt.  Patient states she is sore today. Patient is well, but has had some minor complaints of pain in the knee, requiring pain medications We will start therapy today.  Plan is to go Home after hospital stay.  Objective: Vital signs in last 24 hours: Temp:  [97.5 F (36.4 C)-99 F (37.2 C)] 98.8 F (37.1 C) (05/13 0604) Pulse Rate:  [52-76] 76 (05/13 0604) Resp:  [10-16] 16 (05/13 0604) BP: (106-139)/(54-83) 133/78 mmHg (05/13 0604) SpO2:  [96 %-100 %] 99 % (05/13 0604) Weight:  [143.337 kg (316 lb)] 143.337 kg (316 lb) (05/12 1000)  Intake/Output from previous day:  Intake/Output Summary (Last 24 hours) at 05/10/13 0822 Last data filed at 05/10/13 0604  Gross per 24 hour  Intake 4563.75 ml  Output   3785 ml  Net 778.75 ml    Intake/Output this shift:    Labs:  Recent Labs  05/10/13 0350  HGB 10.8*    Recent Labs  05/10/13 0350  WBC 13.6*  RBC 3.69*  HCT 33.5*  PLT 329    Recent Labs  05/10/13 0350  NA 137  K 4.4  CL 104  CO2 28  BUN 10  CREATININE 0.66  GLUCOSE 110*  CALCIUM 8.2*   No results found for this basename: LABPT, INR,  in the last 72 hours  EXAM General - Patient is Alert, Appropriate and Oriented Extremity - Neurovascular intact Sensation intact distally Dorsiflexion/Plantar flexion intact Dressing - dressing C/D/I Motor Function - intact, moving foot and toes well on exam.  Hemovac pulled without difficulty.  Past Medical History  Diagnosis Date  . Hypothyroidism   . History of kidney stones   . Arthritis     LEFT KNEE PAIN AND OA; S/P LUMBAR FUSION -- SOME BACK STIFFNESS-ESP AFTER LYING DOWN    Assessment/Plan: 1 Day Post-Op Procedure(s) (LRB): LEFT TOTAL KNEE ARTHROPLASTY (Left) Principal Problem:   OA (osteoarthritis) of knee Active Problems:  Postoperative anemia due to acute blood loss  Estimated body mass index is 51.03 kg/(m^2) as calculated from the following:   Height as of this encounter: 5\' 6"  (1.676 m).   Weight as of this encounter: 143.337 kg (316 lb). Advance diet Up with therapy Plan for discharge tomorrow Discharge home with home health  DVT Prophylaxis - Xarelto Weight-Bearing as tolerated to left leg No vaccines. D/C O2 and Pulse OX and try on Room Air  PERKINS, ALEXZANDREW 05/10/2013, 8:22 AM

## 2013-05-11 LAB — BASIC METABOLIC PANEL
BUN: 9 mg/dL (ref 6–23)
Creatinine, Ser: 0.62 mg/dL (ref 0.50–1.10)
GFR calc Af Amer: >90 mL/min (ref >90)
GFR calc non Af Amer: >90 mL/min (ref >90)

## 2013-05-11 LAB — CBC
HCT: 33.4 % — ABNORMAL LOW (ref 36.0–46.0)
MCHC: 32.3 g/dL (ref 30.0–36.0)
MCV: 91.8 fL (ref 78.0–100.0)
RDW: 13 % (ref 11.5–15.5)

## 2013-05-11 NOTE — Progress Notes (Signed)
   Subjective: 2 Days Post-Op Procedure(s) (LRB): LEFT TOTAL KNEE ARTHROPLASTY (Left) Patient reports pain as mild.   Patient seen in rounds for Dr. Lequita Halt. Patient is well, and has had no acute complaints or problems Patient is ready to go home today.  Objective: Vital signs in last 24 hours: Temp:  [98 F (36.7 C)-99.1 F (37.3 C)] 99.1 F (37.3 C) (05/14 0517) Pulse Rate:  [64-76] 64 (05/14 0517) Resp:  [16-18] 16 (05/14 0517) BP: (117-154)/(57-85) 154/85 mmHg (05/14 0517) SpO2:  [97 %-99 %] 98 % (05/14 0517)  Intake/Output from previous day:  Intake/Output Summary (Last 24 hours) at 05/11/13 0744 Last data filed at 05/11/13 0518  Gross per 24 hour  Intake 2083.75 ml  Output   3250 ml  Net -1166.25 ml    Intake/Output this shift:    Labs:  Recent Labs  05/10/13 0350 05/11/13 0335  HGB 10.8* 10.8*    Recent Labs  05/10/13 0350 05/11/13 0335  WBC 13.6* 14.3*  RBC 3.69* 3.64*  HCT 33.5* 33.4*  PLT 329 333    Recent Labs  05/10/13 0350 05/11/13 0335  NA 137 137  K 4.4 3.8  CL 104 102  CO2 28 28  BUN 10 9  CREATININE 0.66 0.62  GLUCOSE 110* 119*  CALCIUM 8.2* 8.5   No results found for this basename: LABPT, INR,  in the last 72 hours  EXAM: General - Patient is Alert, Appropriate and Oriented Extremity - Neurovascular intact Sensation intact distally Dorsiflexion/Plantar flexion intact Incision - clean, dry, no drainage, healing Motor Function - intact, moving foot and toes well on exam.   Assessment/Plan: 2 Days Post-Op Procedure(s) (LRB): LEFT TOTAL KNEE ARTHROPLASTY (Left) Procedure(s) (LRB): LEFT TOTAL KNEE ARTHROPLASTY (Left) Past Medical History  Diagnosis Date  . Hypothyroidism   . History of kidney stones   . Arthritis     LEFT KNEE PAIN AND OA; S/P LUMBAR FUSION -- SOME BACK STIFFNESS-ESP AFTER LYING DOWN   Principal Problem:   OA (osteoarthritis) of knee Active Problems:   Postoperative anemia due to acute blood  loss  Estimated body mass index is 51.03 kg/(m^2) as calculated from the following:   Height as of this encounter: 5\' 6"  (1.676 m).   Weight as of this encounter: 143.337 kg (316 lb). Up with therapy Discharge home with home health Diet - Regular diet Follow up - in 2 weeks Activity - WBAT Disposition - Home Condition Upon Discharge - Good D/C Meds - See DC Summary DVT Prophylaxis - Xarelto  Jocelyn Morris 05/11/2013, 7:44 AM

## 2013-05-11 NOTE — Progress Notes (Signed)
Physical Therapy Treatment Patient Details Name: OBELIA BONELLO MRN: [id number] DOB: 06-22-1961 Today's Date: 05/11/2013 Time: 4098-1191 PT Time Calculation (min): 27 min  PT Assessment / Plan / Recommendation Comments on Treatment Session  Pt ambulated in hallway, practiced step twice, and performed exercises.  Pt given handouts on exercises and step.  Pt and spouse had no further questions/concerns regarding d/c home today.    Follow Up Recommendations  Home health PT     Does the patient have the potential to tolerate intense rehabilitation     Barriers to Discharge        Equipment Recommendations  Rolling walker with 5" wheels    Recommendations for Other Services    Frequency     Plan Discharge plan remains appropriate;Frequency remains appropriate    Precautions / Restrictions Precautions Precautions: Knee Required Braces or Orthoses: Knee Immobilizer - Left Restrictions Weight Bearing Restrictions: No LLE Weight Bearing: Weight bearing as tolerated   Pertinent Vitals/Pain Minimal pain, ice pack applied    Mobility  Bed Mobility Bed Mobility: Not assessed Details for Bed Mobility Assistance: assist for L LE onto bed. Transfers Transfers: Sit to Stand;Stand to Sit Sit to Stand: With upper extremity assist;From chair/3-in-1;5: Supervision Stand to Sit: With upper extremity assist;5: Supervision;To chair/3-in-1 Details for Transfer Assistance: performs safely Ambulation/Gait Ambulation/Gait Assistance: 5: Supervision Ambulation Distance (Feet): 200 Feet Assistive device: Rolling walker Ambulation/Gait Assistance Details: verbal cues for posture Gait Pattern: Step-to pattern;Trunk flexed Gait velocity: decreased Stairs: Yes Stairs Assistance: 5: Supervision Stairs Assistance Details (indicate cue type and reason): verbal cue for safe technique, RW placement, sequence Stair Management Technique: Step to pattern;Forwards;With walker Number of Stairs: 1  (performed x2)    Exercises Total Joint Exercises Ankle Circles/Pumps: AROM;Both;15 reps Quad Sets: AROM;Both;10 reps Gluteal Sets: AROM;Both;10 reps Short Arc QuadBarbaraann Boys;Left;10 reps Heel Slides: Left;10 reps;Seated;AAROM;Supine Hip ABduction/ADduction: 10 reps;AAROM;Left Straight Leg Raises: AAROM;Left;10 reps;Supine   PT Diagnosis:    PT Problem List:   PT Treatment Interventions:     PT Goals Acute Rehab PT Goals PT Goal: Sit to Stand - Progress: Progressing toward goal PT Goal: Stand to Sit - Progress: Progressing toward goal PT Goal: Ambulate - Progress: Progressing toward goal PT Goal: Up/Down Stairs - Progress: Met PT Goal: Perform Home Exercise Program - Progress: Progressing toward goal  Visit Information  Last PT Received On: 05/11/13 Assistance Needed: +1    Subjective Data  Subjective: I was just up with my husband to the bathroom.   Cognition  Cognition Arousal/Alertness: Awake/alert Behavior During Therapy: WFL for tasks assessed/performed Overall Cognitive Status: Within Functional Limits for tasks assessed    Balance     End of Session PT - End of Session Equipment Utilized During Treatment: Left knee immobilizer Activity Tolerance: Patient tolerated treatment well Patient left: in chair;with call bell/phone within reach CPM Left Knee CPM Left Knee: Off   GP     Melvin Whiteford,KATHrine E 05/11/2013, 10:46 AM Zenovia Jarred, PT, DPT 05/11/2013 Pager: [phone]

## 2013-05-11 NOTE — Discharge Summary (Signed)
Physician Discharge Summary   Patient ID: Jocelyn Morris MRN: [id number] DOB/AGE: 04-24-1961 52 y.o.  Admit date: 05/09/2013 Discharge date: 05/11/2013  Primary Diagnosis:  Osteoarthritis Left knee  Admission Diagnoses:  Past Medical History  Diagnosis Date  . Hypothyroidism   . History of kidney stones   . Arthritis     LEFT KNEE PAIN AND OA; S/P LUMBAR FUSION -- SOME BACK STIFFNESS-ESP AFTER LYING DOWN   Discharge Diagnoses:   Principal Problem:   OA (osteoarthritis) of knee Active Problems:   Postoperative anemia due to acute blood loss  Estimated body mass index is 51.03 kg/(m^2) as calculated from the following:   Height as of this encounter: 5\' 6"  (1.676 m).   Weight as of this encounter: 143.337 kg (316 lb).  Procedure:  Procedure(s) (LRB): LEFT TOTAL KNEE ARTHROPLASTY (Left)   Consults: None  HPI: Jocelyn Morris is a 52 y.o. year old female with end stage OA of her left knee with progressively worsening pain and dysfunction. She has constant pain, with activity and at rest and significant functional deficits with difficulties even with ADLs. She has had extensive non-op management including analgesics, injections of cortisone and viscosupplements, and home exercise program, but remains in significant pain with significant dysfunction. Radiographs show bone on bone arthritis medial and patellofemoral. She presents now for left Total Knee Arthroplasty.   Laboratory Data: Admission on 05/09/2013  Component Date Value Range Status  . WBC 05/10/2013 13.6* 4.0 - 10.5 K/uL Final  . RBC 05/10/2013 3.69* 3.87 - 5.11 MIL/uL Final  . Hemoglobin 05/10/2013 10.8* 12.0 - 15.0 g/dL Final  . HCT 40/98/1191 33.5* 36.0 - 46.0 % Final  . MCV 05/10/2013 90.8  78.0 - 100.0 fL Final  . MCH 05/10/2013 29.3  26.0 - 34.0 pg Final  . MCHC 05/10/2013 32.2  30.0 - 36.0 g/dL Final  . RDW 47/82/9562 12.7  11.5 - 15.5 % Final  . Platelets 05/10/2013 329  150 - 400 K/uL Final  . Sodium  05/10/2013 137  135 - 145 mEq/L Final  . Potassium 05/10/2013 4.4  3.5 - 5.1 mEq/L Final  . Chloride 05/10/2013 104  96 - 112 mEq/L Final  . CO2 05/10/2013 28  19 - 32 mEq/L Final  . Glucose, Bld 05/10/2013 110* 70 - 99 mg/dL Final  . BUN 13/07/6577 10  6 - 23 mg/dL Final  . Creatinine, Ser 05/10/2013 0.66  0.50 - 1.10 mg/dL Final  . Calcium 46/96/2952 8.2* 8.4 - 10.5 mg/dL Final  . GFR calc non Af Amer 05/10/2013 >90  >90 mL/min Final  . GFR calc Af Amer 05/10/2013 >90  >90 mL/min Final   Comment:                                 The eGFR has been calculated                          using the CKD EPI equation.                          This calculation has not been                          validated in all clinical  situations.                          eGFR's persistently                          <90 mL/min signify                          possible Chronic Kidney Disease.  . WBC 05/11/2013 14.3* 4.0 - 10.5 K/uL Final  . RBC 05/11/2013 3.64* 3.87 - 5.11 MIL/uL Final  . Hemoglobin 05/11/2013 10.8* 12.0 - 15.0 g/dL Final  . HCT 16/09/9603 33.4* 36.0 - 46.0 % Final  . MCV 05/11/2013 91.8  78.0 - 100.0 fL Final  . MCH 05/11/2013 29.7  26.0 - 34.0 pg Final  . MCHC 05/11/2013 32.3  30.0 - 36.0 g/dL Final  . RDW 54/08/8118 13.0  11.5 - 15.5 % Final  . Platelets 05/11/2013 333  150 - 400 K/uL Final  . Sodium 05/11/2013 137  135 - 145 mEq/L Final  . Potassium 05/11/2013 3.8  3.5 - 5.1 mEq/L Final  . Chloride 05/11/2013 102  96 - 112 mEq/L Final  . CO2 05/11/2013 28  19 - 32 mEq/L Final  . Glucose, Bld 05/11/2013 119* 70 - 99 mg/dL Final  . BUN 14/78/2956 9  6 - 23 mg/dL Final  . Creatinine, Ser 05/11/2013 0.62  0.50 - 1.10 mg/dL Final  . Calcium 21/30/8657 8.5  8.4 - 10.5 mg/dL Final  . GFR calc non Af Amer 05/11/2013 >90  >90 mL/min Final  . GFR calc Af Amer 05/11/2013 >90  >90 mL/min Final   Comment:                                 The eGFR has been calculated                           using the CKD EPI equation.                          This calculation has not been                          validated in all clinical                          situations.                          eGFR's persistently                          <90 mL/min signify                          possible Chronic Kidney Disease.  Hospital Outpatient Visit on 04/29/2013  Component Date Value Range Status  . MRSA, PCR 04/29/2013 NEGATIVE  NEGATIVE Final  . Staphylococcus aureus 04/29/2013 NEGATIVE  NEGATIVE Final   Comment:                                 The  Xpert SA Assay (FDA                          approved for NASAL specimens                          in patients over 59 years of age),                          is one component of                          a comprehensive surveillance                          program.  Test performance has                          been validated by [employer] for patients greater                          than or equal to 48 year old.                          It is not intended                          to diagnose infection nor to                          guide or monitor treatment.  Marland Kitchen aPTT 04/29/2013 32  24 - 37 seconds Final  . WBC 04/29/2013 7.2  4.0 - 10.5 K/uL Final  . RBC 04/29/2013 4.49  3.87 - 5.11 MIL/uL Final  . Hemoglobin 04/29/2013 13.3  12.0 - 15.0 g/dL Final  . HCT 16/09/9603 41.3  36.0 - 46.0 % Final  . MCV 04/29/2013 92.0  78.0 - 100.0 fL Final  . MCH 04/29/2013 29.6  26.0 - 34.0 pg Final  . MCHC 04/29/2013 32.2  30.0 - 36.0 g/dL Final  . RDW 54/08/8118 13.1  11.5 - 15.5 % Final  . Platelets 04/29/2013 357  150 - 400 K/uL Final  . Sodium 04/29/2013 136  135 - 145 mEq/L Final  . Potassium 04/29/2013 4.3  3.5 - 5.1 mEq/L Final  . Chloride 04/29/2013 102  96 - 112 mEq/L Final  . CO2 04/29/2013 27  19 - 32 mEq/L Final  . Glucose, Bld 04/29/2013 90  70 - 99 mg/dL Final  . BUN 14/78/2956 14  6 - 23  mg/dL Final  . Creatinine, Ser 04/29/2013 0.63  0.50 - 1.10 mg/dL Final  . Calcium 21/30/8657 9.0  8.4 - 10.5 mg/dL Final  . Total Protein 04/29/2013 7.1  6.0 - 8.3 g/dL Final  . Albumin 84/69/6295 3.8  3.5 - 5.2 g/dL Final  . AST 28/41/3244 13  0 - 37 U/L Final  . ALT 04/29/2013 10  0 - 35 U/L Final  . Alkaline Phosphatase 04/29/2013 97  39 - 117 U/L Final  . Total Bilirubin 04/29/2013 0.5  0.3 - 1.2 mg/dL Final  .  GFR calc non Af Amer 04/29/2013 >90  >90 mL/min Final  . GFR calc Af Amer 04/29/2013 >90  >90 mL/min Final   Comment:                                 The eGFR has been calculated                          using the CKD EPI equation.                          This calculation has not been                          validated in all clinical                          situations.                          eGFR's persistently                          <90 mL/min signify                          possible Chronic Kidney Disease.  Marland Kitchen Prothrombin Time 04/29/2013 12.9  11.6 - 15.2 seconds Final  . INR 04/29/2013 0.98  0.00 - 1.49 Final  . ABO/RH(D) 04/29/2013 O POS   Final  . Antibody Screen 04/29/2013 NEG   Final  . Sample Expiration 04/29/2013 05/12/2013   Final  . Color, Urine 04/29/2013 YELLOW  YELLOW Final  . APPearance 04/29/2013 CLEAR  CLEAR Final  . Specific Gravity, Urine 04/29/2013 1.028  1.005 - 1.030 Final  . pH 04/29/2013 7.5  5.0 - 8.0 Final  . Glucose, UA 04/29/2013 NEGATIVE  NEGATIVE mg/dL Final  . Hgb urine dipstick 04/29/2013 NEGATIVE  NEGATIVE Final  . Bilirubin Urine 04/29/2013 NEGATIVE  NEGATIVE Final  . Ketones, ur 04/29/2013 NEGATIVE  NEGATIVE mg/dL Final  . Protein, ur 16/09/9603 NEGATIVE  NEGATIVE mg/dL Final  . Urobilinogen, UA 04/29/2013 0.2  0.0 - 1.0 mg/dL Final  . Nitrite 54/08/8118 NEGATIVE  NEGATIVE Final  . Leukocytes, UA 04/29/2013 NEGATIVE  NEGATIVE Final   MICROSCOPIC NOT DONE ON URINES WITH NEGATIVE PROTEIN, BLOOD, LEUKOCYTES, NITRITE, OR GLUCOSE  <1000 mg/dL.  . Preg, Serum 04/29/2013 NEGATIVE  NEGATIVE Final  . ABO/RH(D) 04/29/2013 O POS   Final     X-Rays:No results found.  EKG: Orders placed during the hospital encounter of 04/29/13  . EKG 12-LEAD  . EKG 12-LEAD     Hospital Course: Jocelyn Morris is a 52 y.o. who was admitted to [facility]. They were brought to the operating room on 05/09/2013 and underwent Procedure(s): LEFT TOTAL KNEE ARTHROPLASTY.  Patient tolerated the procedure well and was later transferred to the recovery room and then to the orthopaedic floor for postoperative care.  They were given PO and IV analgesics for pain control following their surgery.  They were given 24 hours of postoperative antibiotics of  Anti-infectives   Start     Dose/Rate Route Frequency Ordered Stop   05/09/13 1900  vancomycin (VANCOCIN) IVPB 1000 mg/200 mL premix  1,000 mg 200 mL/hr over 60 Minutes Intravenous Every 12 hours 05/09/13 1001 05/09/13 1951   05/09/13 0600  vancomycin (VANCOCIN) 1,500 mg in sodium chloride 0.9 % 500 mL IVPB     1,500 mg 250 mL/hr over 120 Minutes Intravenous On call to O.R. 05/08/13 1522 05/09/13 0654     and started on DVT prophylaxis in the form of Xarelto.   PT and OT were ordered for total joint protocol.  Discharge planning consulted to help with postop disposition and equipment needs.  Patient had a decent night on the evening of surgery.  They started to get up OOB with therapy on day one. Hemovac drain was pulled without difficulty.  Continued to work with therapy into day two.  Dressing was changed on day two and the incision was healing well. Patient was seen in rounds and was ready to go home later that same day on POD 2.   Discharge Medications: Prior to Admission medications   Medication Sig Start Date End Date Taking? Authorizing Provider  levothyroxine (SYNTHROID, LEVOTHROID) 150 MCG tablet Take 150 mcg by mouth daily before breakfast.   Yes Historical Provider, MD   methocarbamol (ROBAXIN) 500 MG tablet Take 1 tablet (500 mg total) by mouth every 6 (six) hours as needed. 05/10/13   Thora Scherman, PA-C  oxyCODONE (OXY IR/ROXICODONE) 5 MG immediate release tablet Take 1-2 tablets (5-10 mg total) by mouth every 3 (three) hours as needed. 05/10/13   Gussie Towson Julien Girt, PA-C  polycarbophil (FIBERCON) 625 MG tablet Take 625 mg by mouth daily.    Historical Provider, MD  rivaroxaban (XARELTO) 10 MG TABS tablet Take 1 tablet (10 mg total) by mouth daily with breakfast. 05/10/13   Ledora Delker Julien Girt, PA-C  traMADol (ULTRAM) 50 MG tablet Take 1-2 tablets (50-100 mg total) by mouth every 6 (six) hours as needed. 05/10/13   Isaly Fasching Julien Girt, PA-C    Diet: Regular diet Activity:WBAT Follow-up:in 2 weeks Disposition - Home Discharged Condition: good   Discharge Orders   Future Orders Complete By Expires     Call MD / Call 911  As directed     Comments:      If you experience chest pain or shortness of breath, CALL 911 and be transported to the hospital emergency room.  If you develope a fever above 101 F, pus (white drainage) or increased drainage or redness at the wound, or calf pain, call your surgeon's office.    Change dressing  As directed     Comments:      Change dressing daily with sterile 4 x 4 inch gauze dressing and apply TED hose. Do not submerge the incision under water.    Constipation Prevention  As directed     Comments:      Drink plenty of fluids.  Prune juice may be helpful.  You may use a stool softener, such as Colace (over the counter) 100 mg twice a day.  Use MiraLax (over the counter) for constipation as needed.    Diet general  As directed     Discharge instructions  As directed     Comments:      Pick up stool softner and laxative for home. Do not submerge incision under water. May shower. Continue to use ice for pain and swelling from surgery.  Take Xarelto for two and a half more weeks, then discontinue Xarelto.    Do not  put a pillow under the knee. Place it under the heel.  As directed  Do not sit on low chairs, stoools or toilet seats, as it may be difficult to get up from low surfaces  As directed     Driving restrictions  As directed     Comments:      No driving until released by the physician.    Increase activity slowly as tolerated  As directed     Lifting restrictions  As directed     Comments:      No lifting until released by the physician.    Patient may shower  As directed     Comments:      You may shower without a dressing once there is no drainage.  Do not wash over the wound.  If drainage remains, do not shower until drainage stops.    TED hose  As directed     Comments:      Use stockings (TED hose) for 3 weeks on both leg(s).  You may remove them at night for sleeping.    Weight bearing as tolerated  As directed         Medication List    TAKE these medications       levothyroxine 150 MCG tablet  Commonly known as:  SYNTHROID, LEVOTHROID  Take 150 mcg by mouth daily before breakfast.     methocarbamol 500 MG tablet  Commonly known as:  ROBAXIN  Take 1 tablet (500 mg total) by mouth every 6 (six) hours as needed.     oxyCODONE 5 MG immediate release tablet  Commonly known as:  Oxy IR/ROXICODONE  Take 1-2 tablets (5-10 mg total) by mouth every 3 (three) hours as needed.     polycarbophil 625 MG tablet  Commonly known as:  FIBERCON  Take 625 mg by mouth daily.     rivaroxaban 10 MG Tabs tablet  Commonly known as:  XARELTO  Take 1 tablet (10 mg total) by mouth daily with breakfast.     traMADol 50 MG tablet  Commonly known as:  ULTRAM  Take 1-2 tablets (50-100 mg total) by mouth every 6 (six) hours as needed.           Follow-up Information   Follow up with Loanne Drilling, MD. Schedule an appointment as soon as possible for a visit in 2 weeks.   Contact information:   [address] [place]  [address] [phone]       Signed: Patrica Duel 05/11/2013, 7:47 AM

## 2013-05-31 ENCOUNTER — Ambulatory Visit: Payer: BC Managed Care – PPO | Attending: Orthopedic Surgery | Admitting: Physical Therapy

## 2013-05-31 DIAGNOSIS — IMO0001 Reserved for inherently not codable concepts without codable children: Secondary | ICD-10-CM | POA: Insufficient documentation

## 2013-05-31 DIAGNOSIS — M25669 Stiffness of unspecified knee, not elsewhere classified: Secondary | ICD-10-CM | POA: Insufficient documentation

## 2013-05-31 DIAGNOSIS — Z96659 Presence of unspecified artificial knee joint: Secondary | ICD-10-CM | POA: Insufficient documentation

## 2013-05-31 DIAGNOSIS — M25569 Pain in unspecified knee: Secondary | ICD-10-CM | POA: Insufficient documentation

## 2013-05-31 DIAGNOSIS — R5381 Other malaise: Secondary | ICD-10-CM | POA: Insufficient documentation

## 2013-06-02 ENCOUNTER — Ambulatory Visit: Payer: BC Managed Care – PPO | Admitting: Physical Therapy

## 2013-06-06 ENCOUNTER — Ambulatory Visit: Payer: BC Managed Care – PPO

## 2013-06-08 ENCOUNTER — Ambulatory Visit: Payer: BC Managed Care – PPO | Admitting: Physical Therapy

## 2013-06-10 ENCOUNTER — Ambulatory Visit: Payer: BC Managed Care – PPO | Admitting: Physical Therapy

## 2013-06-13 ENCOUNTER — Ambulatory Visit: Payer: BC Managed Care – PPO | Admitting: Physical Therapy

## 2013-06-15 ENCOUNTER — Ambulatory Visit: Payer: BC Managed Care – PPO | Admitting: Physical Therapy

## 2013-06-17 ENCOUNTER — Ambulatory Visit: Payer: BC Managed Care – PPO | Admitting: Physical Therapy

## 2013-06-20 ENCOUNTER — Ambulatory Visit: Payer: BC Managed Care – PPO | Admitting: Physical Therapy

## 2013-06-22 ENCOUNTER — Ambulatory Visit: Payer: BC Managed Care – PPO | Admitting: Physical Therapy

## 2013-06-24 ENCOUNTER — Ambulatory Visit: Payer: BC Managed Care – PPO | Admitting: Physical Therapy

## 2013-06-27 ENCOUNTER — Ambulatory Visit: Payer: BC Managed Care – PPO | Admitting: Physical Therapy

## 2013-06-29 ENCOUNTER — Ambulatory Visit: Payer: BC Managed Care – PPO | Attending: Orthopedic Surgery | Admitting: Physical Therapy

## 2013-06-29 DIAGNOSIS — R5381 Other malaise: Secondary | ICD-10-CM | POA: Insufficient documentation

## 2013-06-29 DIAGNOSIS — M25669 Stiffness of unspecified knee, not elsewhere classified: Secondary | ICD-10-CM | POA: Insufficient documentation

## 2013-06-29 DIAGNOSIS — Z96659 Presence of unspecified artificial knee joint: Secondary | ICD-10-CM | POA: Insufficient documentation

## 2013-06-29 DIAGNOSIS — M25569 Pain in unspecified knee: Secondary | ICD-10-CM | POA: Insufficient documentation

## 2013-06-29 DIAGNOSIS — IMO0001 Reserved for inherently not codable concepts without codable children: Secondary | ICD-10-CM | POA: Insufficient documentation

## 2013-07-04 ENCOUNTER — Ambulatory Visit: Payer: BC Managed Care – PPO | Admitting: Physical Therapy

## 2013-07-06 ENCOUNTER — Ambulatory Visit: Payer: BC Managed Care – PPO | Admitting: Physical Therapy

## 2013-07-08 ENCOUNTER — Ambulatory Visit: Payer: BC Managed Care – PPO | Admitting: Physical Therapy

## 2013-07-11 ENCOUNTER — Ambulatory Visit: Payer: BC Managed Care – PPO | Admitting: Physical Therapy

## 2013-07-13 ENCOUNTER — Ambulatory Visit: Payer: BC Managed Care – PPO | Admitting: Physical Therapy

## 2013-07-15 ENCOUNTER — Ambulatory Visit: Payer: BC Managed Care – PPO | Admitting: Physical Therapy

## 2013-07-18 ENCOUNTER — Ambulatory Visit: Payer: BC Managed Care – PPO | Admitting: Physical Therapy

## 2013-07-20 ENCOUNTER — Encounter: Payer: BC Managed Care – PPO | Admitting: Physical Therapy

## 2013-09-27 ENCOUNTER — Encounter (INDEPENDENT_AMBULATORY_CARE_PROVIDER_SITE_OTHER): Payer: Self-pay

## 2017-03-31 ENCOUNTER — Encounter (HOSPITAL_COMMUNITY): Payer: Self-pay

## 2018-03-29 ENCOUNTER — Encounter (HOSPITAL_COMMUNITY): Payer: Self-pay

## 2018-12-30 ENCOUNTER — Other Ambulatory Visit: Payer: Self-pay | Admitting: Surgical Oncology

## 2018-12-30 DIAGNOSIS — R1110 Vomiting, unspecified: Secondary | ICD-10-CM

## 2018-12-31 ENCOUNTER — Ambulatory Visit
Admission: RE | Admit: 2018-12-31 | Discharge: 2018-12-31 | Disposition: A | Discharge status: Home / Self Care | Payer: BC Managed Care – PPO | Source: Ambulatory Visit | Attending: Surgical Oncology | Admitting: Surgical Oncology

## 2018-12-31 DIAGNOSIS — R1110 Vomiting, unspecified: Secondary | ICD-10-CM

## 2020-01-27 ENCOUNTER — Emergency Department (HOSPITAL_COMMUNITY): Payer: BC Managed Care – PPO

## 2020-01-27 ENCOUNTER — Encounter (HOSPITAL_COMMUNITY): Payer: Self-pay

## 2020-01-27 ENCOUNTER — Emergency Department (HOSPITAL_COMMUNITY)
Admission: EM | Admit: 2020-01-27 | Discharge: 2020-01-27 | Disposition: A | Discharge status: Home / Self Care | Payer: BC Managed Care – PPO | Attending: Emergency Medicine | Admitting: Emergency Medicine

## 2020-01-27 ENCOUNTER — Other Ambulatory Visit: Payer: Self-pay

## 2020-01-27 DIAGNOSIS — Y9389 Activity, other specified: Secondary | ICD-10-CM | POA: Insufficient documentation

## 2020-01-27 DIAGNOSIS — M1712 Unilateral primary osteoarthritis, left knee: Secondary | ICD-10-CM | POA: Diagnosis not present

## 2020-01-27 DIAGNOSIS — W001XXA Fall from stairs and steps due to ice and snow, initial encounter: Secondary | ICD-10-CM | POA: Insufficient documentation

## 2020-01-27 DIAGNOSIS — S76301A Unspecified injury of muscle, fascia and tendon of the posterior muscle group at thigh level, right thigh, initial encounter: Secondary | ICD-10-CM

## 2020-01-27 DIAGNOSIS — Y929 Unspecified place or not applicable: Secondary | ICD-10-CM | POA: Insufficient documentation

## 2020-01-27 DIAGNOSIS — I10 Essential (primary) hypertension: Secondary | ICD-10-CM | POA: Insufficient documentation

## 2020-01-27 DIAGNOSIS — M79651 Pain in right thigh: Secondary | ICD-10-CM | POA: Diagnosis not present

## 2020-01-27 DIAGNOSIS — Y999 Unspecified external cause status: Secondary | ICD-10-CM | POA: Diagnosis not present

## 2020-01-27 DIAGNOSIS — Z79899 Other long term (current) drug therapy: Secondary | ICD-10-CM | POA: Diagnosis not present

## 2020-01-27 DIAGNOSIS — S76801A Unspecified injury of other specified muscles, fascia and tendons at thigh level, right thigh, initial encounter: Secondary | ICD-10-CM | POA: Insufficient documentation

## 2020-01-27 DIAGNOSIS — R0602 Shortness of breath: Secondary | ICD-10-CM | POA: Insufficient documentation

## 2020-01-27 DIAGNOSIS — S79921A Unspecified injury of right thigh, initial encounter: Secondary | ICD-10-CM | POA: Diagnosis present

## 2020-01-27 DIAGNOSIS — Z981 Arthrodesis status: Secondary | ICD-10-CM | POA: Diagnosis not present

## 2020-01-27 HISTORY — DX: Essential (primary) hypertension: I10

## 2020-01-27 HISTORY — DX: Lymphedema, not elsewhere classified: I89.0

## 2020-01-27 MED ORDER — OXYCODONE-ACETAMINOPHEN 5-325 MG PO TABS: 1.0000 | ORAL_TABLET | Freq: Four times a day (QID) | ORAL | 0 refills | Status: DC | PRN

## 2020-01-27 MED ORDER — ONDANSETRON 4 MG PO TBDP
4.0000 mg | ORAL_TABLET | Freq: Once | ORAL | Status: AC
  Administered 2020-01-27: 4 mg via ORAL
  Filled 2020-01-27: qty 1

## 2020-01-27 MED ORDER — ONDANSETRON HCL 4 MG/2ML IJ SOLN
4.0000 mg | Freq: Once | INTRAMUSCULAR | Status: AC
Start: 2020-01-27 — End: 2020-01-27
  Administered 2020-01-27: 11:00:00 4 mg via INTRAVENOUS
  Filled 2020-01-27: qty 2

## 2020-01-27 MED ORDER — HYDROMORPHONE HCL 1 MG/ML IJ SOLN
1.0000 mg | Freq: Once | INTRAMUSCULAR | Status: AC
Start: 2020-01-27 — End: 2020-01-27
  Administered 2020-01-27: 1 mg via INTRAVENOUS
  Filled 2020-01-27: qty 1

## 2020-01-27 MED ORDER — ALBUTEROL SULFATE (2.5 MG/3ML) 0.083% IN NEBU
2.5000 mg | INHALATION_SOLUTION | Freq: Once | RESPIRATORY_TRACT | Status: AC
  Administered 2020-01-27: 2.5 mg via RESPIRATORY_TRACT
  Filled 2020-01-27: qty 3

## 2020-01-27 NOTE — ED Notes (Signed)
Pulled pt's mask down to give medication.  Pts lips were bluish tint and nailbeds were cyanotic. O2 sats were at 60% on room air.  Pt talking and answering questions.  Placed on 6l o2 via n/c.  Pt lips and nailbeds pinked up immediately. Sats went up to 99%  edp notified.

## 2020-01-27 NOTE — ED Provider Notes (Signed)
[facility] EMERGENCY DEPARTMENT Provider Note   CSN: [id number] Arrival date & time: 01/27/20  0944     History Chief Complaint  Patient presents with  . Fall    Jocelyn Morris is a 59 y.o. female.  Patient fell and hurt her right leg.  She has pain running down the back of her right leg  The history is provided by the patient. No language interpreter was used.  Fall This is a new problem. The current episode started less than 1 hour ago. The problem occurs rarely. The problem has been resolved. Pertinent negatives include no chest pain, no abdominal pain and no headaches. Exacerbated by: Movement. Nothing relieves the symptoms. She has tried nothing for the symptoms. The treatment provided no relief.       Past Medical History:  Diagnosis Date  . Arthritis    LEFT KNEE PAIN AND OA; S/P LUMBAR FUSION -- SOME BACK STIFFNESS-ESP AFTER LYING DOWN  . History of kidney stones   . Hypertension   . Hypothyroidism    hypothyroid  . Lymphedema     Patient Active Problem List   Diagnosis Date Noted  . Postoperative anemia due to acute blood loss 05/10/2013  . OA (osteoarthritis) of knee 05/09/2013    Past Surgical History:  Procedure Laterality Date  . BACK SURGERY  1999   LUMBAR FUSION- 4 CAGES  . DILATION AND CURETTAGE OF UTERUS  2011  . LAP BAND SURGERY  2009  . TONSILLECTOMY  1984  . TOTAL KNEE ARTHROPLASTY Left 05/09/2013   Procedure: LEFT TOTAL KNEE ARTHROPLASTY;  Surgeon: Loanne Drilling, MD;  Location: [facility];  Service: Orthopedics;  Laterality: Left;  . URETEROSCOPY FOR STONE REMOVAL       OB History   No obstetric history on file.     No family history on file.  Social History   Tobacco Use  . Smoking status: Never Smoker  . Smokeless tobacco: Never Used  Substance Use Topics  . Alcohol use: No  . Drug use: No    Home Medications Prior to Admission medications   Medication Sig Start Date End Date Taking? Authorizing Provider  acetaminophen  (TYLENOL) 500 MG tablet Take 1-2 tablets by mouth daily as needed.   Yes [provider]  calcium-vitamin D (OSCAL WITH D) 250-125 MG-UNIT tablet Take 1 tablet by mouth daily.   Yes [provider]  furosemide (LASIX) 20 MG tablet Take 20 mg by mouth daily as needed for edema.  01/02/20  Yes [provider]  levothyroxine (SYNTHROID) 200 MCG tablet Take 200 mcg by mouth daily. 01/03/20  Yes [provider]  losartan (COZAAR) 50 MG tablet Take 50 mg by mouth daily. 12/27/19  Yes [provider]  Multiple Vitamin (MULTIVITAMIN WITH MINERALS) TABS tablet Take 1 tablet by mouth daily.   Yes [provider]  nabumetone (RELAFEN) 750 MG tablet Take 750 mg by mouth at bedtime.  12/27/19  Yes [provider]  naproxen sodium (ALEVE) 220 MG tablet Take 440 mg by mouth daily as needed.   Yes [provider]  polycarbophil (FIBERCON) 625 MG tablet Take 625 mg by mouth daily.   Yes [provider]  traMADol (ULTRAM) 50 MG tablet Take 1-2 tablets (50-100 mg total) by mouth every 6 (six) hours as needed. Patient taking differently: Take 50 mg by mouth every 6 (six) hours as needed.  05/10/13  Yes Perkins, Alexzandrew L, PA-C  zolpidem (AMBIEN) 5 MG tablet Take  5 mg by mouth at bedtime as needed. 12/01/19  Yes [provider]  oxyCODONE-acetaminophen (PERCOCET/ROXICET) 5-325 MG tablet Take 1 tablet by mouth every 6 (six) hours as needed. 01/27/20   Bethann Berkshire, MD    Allergies    Keflex [cephalexin] and Penicillins  Review of Systems   Review of Systems  Constitutional: Negative for appetite change and fatigue.  HENT: Negative for congestion, ear discharge and sinus pressure.   Eyes: Negative for discharge.  Respiratory: Negative for cough.   Cardiovascular: Negative for chest pain.  Gastrointestinal: Negative for abdominal pain and diarrhea.  Genitourinary: Negative for frequency and hematuria.  Musculoskeletal:  Negative for back pain.       Right leg pain  Skin: Negative for rash.  Neurological: Negative for seizures and headaches.  Psychiatric/Behavioral: Negative for hallucinations.    Physical Exam Updated Vital Signs BP (!) 119/55   Pulse 71   Temp 98.7 F (37.1 C) (Oral)   Resp 18   Ht 5\' 6"  (1.676 m)   Wt (!) 195 kg   LMP 03/25/2013   SpO2 96%   BMI 69.40 kg/m   Physical Exam Vitals and nursing note reviewed.  Constitutional:      Appearance: She is well-developed.  HENT:     Head: Normocephalic.     Nose: Nose normal.  Eyes:     General: No scleral icterus.    Conjunctiva/sclera: Conjunctivae normal.  Neck:     Thyroid: No thyromegaly.  Cardiovascular:     Rate and Rhythm: Normal rate and regular rhythm.     Heart sounds: No murmur. No friction rub. No gallop.   Pulmonary:     Breath sounds: No stridor. No wheezing or rales.  Chest:     Chest wall: No tenderness.  Abdominal:     General: There is no distension.     Tenderness: There is no abdominal tenderness. There is no rebound.  Musculoskeletal:        General: Normal range of motion.     Cervical back: Neck supple.     Comments: Patient with tenderness to back of right leg.  Lymphadenopathy:     Cervical: No cervical adenopathy.  Skin:    Findings: No erythema or rash.  Neurological:     Mental Status: She is alert and oriented to person, place, and time.     Motor: No abnormal muscle tone.     Coordination: Coordination normal.  Psychiatric:        Behavior: Behavior normal.     ED Results / Procedures / Treatments   Labs (all labs ordered are listed, but only abnormal results are displayed) Labs Reviewed - No data to display  EKG None  Radiology DG Lumbar Spine Complete  Result Date: 01/27/2020 CLINICAL DATA:  Slipped on ice going down steps injuring back of leg from knee up, prior low back surgery EXAM: LUMBAR SPINE - COMPLETE 4+ VIEW COMPARISON:  None; correlation MR lumbar spine  11/06/1998 FINDINGS: Prior MRI labeled with BILATERAL Ray cage fusion of L4-L5 and L5-S1, current exam labeled accordingly. Mild osseous demineralization. BILATERAL ray cages at L4-L5 and L5-S1. Scattered mild endplate spur formation in the lower thoracic and lumbar spine. Vertebral body heights maintained without fracture or subluxation. No bone destruction or definite spondylolysis. SI joints appear preserved. IMPRESSION: Prior lower lumbar surgery. Scattered degenerative disc disease changes. No acute osseous abnormalities. Electronically Signed   By: 13/08/1998 M.D.   On: 01/27/2020 12:38   DG  Hip Unilat W or Wo Pelvis 2-3 Views Right  Result Date: 01/27/2020 CLINICAL DATA:  Slipped on ice going down stairs, injury to back of RIGHT leg EXAM: DG HIP (WITH OR WITHOUT PELVIS) 2-3V RIGHT COMPARISON:  None FINDINGS: Paired ray cages at adjacent levels of the lower lumbar spine. Osteitis pubis. Hip and SI joint spaces preserved. No acute fracture, dislocation, or bone destruction. IMPRESSION: No acute osseous abnormalities. Electronically Signed   By: Lavonia Dana M.D.   On: 01/27/2020 12:41    Procedures Procedures (including critical care time)  Medications Ordered in ED Medications  HYDROmorphone (DILAUDID) injection 1 mg (1 mg Intravenous Given 01/27/20 1057)  albuterol (PROVENTIL) (2.5 MG/3ML) 0.083% nebulizer solution 2.5 mg (2.5 mg Nebulization Given 01/27/20 1055)  ondansetron (ZOFRAN) injection 4 mg (4 mg Intravenous Given 01/27/20 1057)  ondansetron (ZOFRAN-ODT) disintegrating tablet 4 mg (4 mg Oral Given 01/27/20 1321)    ED Course  I have reviewed the triage vital signs and the nursing notes.  Pertinent labs & imaging results that were available during my care of the patient were reviewed by me and considered in my medical decision making (see chart for details).    MDM Rules/Calculators/A&P                     Patient with fall.  X-rays unremarkable.  Pain and tenderness to back of  right thigh.  Suspect hamstring injury.  Patient will follow up with orthopedic Final Clinical Impression(s) / ED Diagnoses Final diagnoses:  Hamstring injury, right, initial encounter    Rx / DC Orders ED Discharge Orders         Ordered    oxyCODONE-acetaminophen (PERCOCET/ROXICET) 5-325 MG tablet  Every 6 hours PRN     01/27/20 1321           Milton Ferguson, MD 01/30/20 0840

## 2020-01-27 NOTE — ED Triage Notes (Signed)
Pt slipped on ice going down steps. Injured right back of leg from knee up. Feels like it is spasming

## 2020-01-27 NOTE — Discharge Instructions (Addendum)
Use a walker to ambulate.  Try to rest your leg for the next for 5 days.  Follow-up with Dr. Romeo Apple next week

## 2020-01-27 NOTE — ED Notes (Signed)
Ambulated to restroom with no difficulty, steady gait.

## 2020-03-04 ENCOUNTER — Ambulatory Visit: Payer: BC Managed Care – PPO

## 2020-03-20 ENCOUNTER — Other Ambulatory Visit: Payer: Self-pay

## 2020-03-20 ENCOUNTER — Ambulatory Visit: Payer: BC Managed Care – PPO | Attending: Orthopedic Surgery | Admitting: Physical Therapy

## 2020-03-20 DIAGNOSIS — M79662 Pain in left lower leg: Secondary | ICD-10-CM | POA: Diagnosis not present

## 2020-03-20 DIAGNOSIS — M6281 Muscle weakness (generalized): Secondary | ICD-10-CM | POA: Diagnosis present

## 2020-03-20 NOTE — Therapy (Signed)
[facility] [place], [place], [address] Phone: [phone]   Fax:  [phone]  Physical Therapy Evaluation  Patient Details  Name: Jocelyn Morris MRN: [id number] Date of Birth: Oct 11, 1961 Referring Provider (PT): Rod Can MD.   Encounter Date: 03/20/2020  PT End of Session - 03/20/20 1542    Visit Number  1    Number of Visits  12    Date for PT Re-Evaluation  04/17/20    Authorization Type  FOTO.    PT Start Time  [phone]    PT Stop Time  0400    PT Time Calculation (min)  47 min       Past Medical History:  Diagnosis Date  . Arthritis    LEFT KNEE PAIN AND OA; S/P LUMBAR FUSION -- SOME BACK STIFFNESS-ESP AFTER LYING DOWN  . History of kidney stones   . Hypertension   . Hypothyroidism    hypothyroid  . Lymphedema     Past Surgical History:  Procedure Laterality Date  . BACK SURGERY  1999   LUMBAR FUSION- 4 CAGES  . DILATION AND CURETTAGE OF UTERUS  2011  . LAP BAND SURGERY  2009  . TONSILLECTOMY  1984  . TOTAL KNEE ARTHROPLASTY Left 05/09/2013   Procedure: LEFT TOTAL KNEE ARTHROPLASTY;  Surgeon: Gearlean Alf, MD;  Location: [facility];  Service: Orthopedics;  Laterality: Left;  . URETEROSCOPY FOR STONE REMOVAL      There were no vitals filed for this visit.   Subjective Assessment - 03/20/20 1546    Subjective  COVID-19 screen performed prior to patient entering clinic. The patient report sfalling on 01/27/20 and sustaining a right hamstring injury.  She had pictures of her injury that had a great deal of ecchymosis.  It is not as discolored now.  Her pain at rest is a 3/10 today but can rise to much higher levels with certain movements and putting pressure on her hamstring.  In fact she is unable to use her compression garment for lymphedema due to pain.    Pertinent History  Right TKA, arthrits, back surgery, HTN, lymphedema, hypothyroidism.    How long can you walk comfortably?  Short distances with a  FWW.    Diagnostic tests  MRI.    Patient Stated Goals  Get out of pain.    Currently in Pain?  Yes    Pain Score  3     Pain Location  Leg    Pain Orientation  Right    Pain Descriptors / Indicators  Sore;Numbness    Pain Type  Acute pain    Pain Onset  More than a month ago    Pain Frequency  Constant    Aggravating Factors   See above.    Pain Relieving Factors  Rest without pressure on hamstring.         [facility] PT Assessment - 03/20/20 0001      Assessment   Medical Diagnosis  Strain of hamstring tendon.    Referring Provider (PT)  Rod Can MD.    Onset Date/Surgical Date  --   01/27/20     Precautions   Precautions  None      Restrictions   Weight Bearing Restrictions  No      Balance Screen   Has the patient fallen in the past 6 months  Yes    How many times?  --   1.   Has the patient had a decrease  in activity level because of a fear of falling?   Yes    Is the patient reluctant to leave their home because of a fear of falling?   Yes      Home Environment   Living Environment  Private residence      Prior Function   Level of Independence  Independent      Observation/Other Assessments   Focus on Therapeutic Outcomes (FOTO)   53% limitation.      ROM / Strength   AROM / PROM / Strength  AROM;Strength      AROM   Overall AROM Comments  WFL for right knee flex and extension.      Strength   Overall Strength Comments  Cannot perform a right SLR due to pain.      Palpation   Palpation comment  Tender to palpation over right distal hamstrings medial more than lateral and a "hard" feel due to hematoma present.      Ambulation/Gait   Gait Comments  Slow and cautious with a FWW.                Objective measurements completed on examination: See above findings.      [facility] Adult PT Treatment/Exercise - 03/20/20 0001      Modalities   Modalities  Electrical Stimulation;Moist Heat      Moist Heat Therapy   Number Minutes Moist Heat   20 Minutes    Moist Heat Location  --   Right distal hamstrings.     Electrical Stimulation   Electrical Stimulation Location  Right distal medial hamstrings.    Electrical Stimulation Action  Pre-mod.    Electrical Stimulation Parameters  80-150 Hz x 20 minutes.    Electrical Stimulation Goals  Pain                  PT Long Term Goals - 03/20/20 1610      PT LONG TERM GOAL #1   Title  Independent with a HEP.    Time  4    Period  Weeks    Status  New      PT LONG TERM GOAL #2   Title  Walk without walker.    Time  4    Period  Weeks    Status  New      PT LONG TERM GOAL #3   Title  Perform ADL's with pain not > 2-3/10.    Time  4    Period  Weeks    Status  New      PT LONG TERM GOAL #4   Title  Perform a right SLR.    Time  4    Period  Weeks    Status  New             Plan - 03/20/20 1606    Clinical Impression Statement  The patient presents to OPPT with c/o right distal hamstring pain medial more than lateral due to a fall on 01/27/20.  She is very tender to palpation especially mediall and it has a "hard" feel due to hematoma.  She is walking cautiously with a FWW for safety.  Patient will benefit from skilled physical therapy intervention to address deficits and pain.    Personal Factors and Comorbidities  Comorbidity 1;Comorbidity 2    Comorbidities  Right TKA, arthrits, back surgery, HTN, lymphedema, hypothyroidism.    Examination-Activity Limitations  Locomotion Level;Other    Examination-Participation Restrictions  Other  Stability/Clinical Decision Making  Stable/Uncomplicated    Clinical Decision Making  Low    Rehab Potential  Good    PT Frequency  3x / week    PT Duration  4 weeks    PT Treatment/Interventions  ADLs/Self Care Home Management;Cryotherapy;Electrical Stimulation;Ultrasound;Moist Heat;Therapeutic activities;Therapeutic exercise;Manual techniques;Patient/family education    PT Next Visit Plan  U/S at STW/M to patient  right distal hamstrings.  HMP/CP and electrical stimulation.  Low-level ther ex.    Consulted and Agree with Plan of Care  Patient       Patient will benefit from skilled therapeutic intervention in order to improve the following deficits and impairments:  Abnormal gait, Decreased activity tolerance, Pain, Decreased strength  Visit Diagnosis: Pain in left lower leg - Plan: PT plan of care cert/re-cert  Muscle weakness (generalized) - Plan: PT plan of care cert/re-cert     Problem List Patient Active Problem List   Diagnosis Date Noted  . Postoperative anemia due to acute blood loss 05/10/2013  . OA (osteoarthritis) of knee 05/09/2013    River Ambrosio, [place] MPT 03/20/2020, 4:13 PM  [facility] [address] [place], [place], [address] Phone: [phone]   Fax:  [phone]  Name: CACI ORREN MRN: [id number] Date of Birth: Sep 03, 1961

## 2020-03-22 ENCOUNTER — Other Ambulatory Visit: Payer: Self-pay

## 2020-03-22 ENCOUNTER — Ambulatory Visit: Payer: BC Managed Care – PPO | Admitting: Physical Therapy

## 2020-03-22 DIAGNOSIS — M6281 Muscle weakness (generalized): Secondary | ICD-10-CM

## 2020-03-22 DIAGNOSIS — M79662 Pain in left lower leg: Secondary | ICD-10-CM

## 2020-03-22 NOTE — Therapy (Signed)
[facility] [address] [place], [place], [address] Phone: [phone]   Fax:  [phone]  Physical Therapy Treatment  Patient Details  Name: Jocelyn Morris MRN: [id number] Date of Birth: 09/13/1961 Referring Provider (PT): Samson Frederic MD.   Encounter Date: 03/22/2020  PT End of Session - 03/22/20 1651    Visit Number  2    Number of Visits  12    Date for PT Re-Evaluation  04/17/20    Authorization Type  FOTO.    PT Start Time  0318    PT Stop Time  0409    PT Time Calculation (min)  51 min    Activity Tolerance  Patient tolerated treatment well    Behavior During Therapy  [facility] for tasks assessed/performed       Past Medical History:  Diagnosis Date  . Arthritis    LEFT KNEE PAIN AND OA; S/P LUMBAR FUSION -- SOME BACK STIFFNESS-ESP AFTER LYING DOWN  . History of kidney stones   . Hypertension   . Hypothyroidism    hypothyroid  . Lymphedema     Past Surgical History:  Procedure Laterality Date  . BACK SURGERY  1999   LUMBAR FUSION- 4 CAGES  . DILATION AND CURETTAGE OF UTERUS  2011  . LAP BAND SURGERY  2009  . TONSILLECTOMY  1984  . TOTAL KNEE ARTHROPLASTY Left 05/09/2013   Procedure: LEFT TOTAL KNEE ARTHROPLASTY;  Surgeon: Loanne Drilling, MD;  Location: [facility];  Service: Orthopedics;  Laterality: Left;  . URETEROSCOPY FOR STONE REMOVAL      There were no vitals filed for this visit.  Subjective Assessment - 03/22/20 1646    Subjective  COVID-19 screen performed prior to patient entering clinic.  No new complaints.    Pertinent History  Right TKA, arthrits, back surgery, HTN, lymphedema, hypothyroidism.    How long can you walk comfortably?  Short distances with a FWW.    Diagnostic tests  MRI.    Patient Stated Goals  Get out of pain.    Currently in Pain?  Yes    Pain Score  3     Pain Location  Leg    Pain Orientation  Right    Pain Descriptors / Indicators  Sore;Numbness    Pain Type  Acute pain    Pain Onset  More than a month ago                       [facility] Adult PT Treatment/Exercise - 03/22/20 0001      Modalities   Modalities  Electrical Stimulation;Moist Heat;Ultrasound      Moist Heat Therapy   Number Minutes Moist Heat  18 Minutes    Moist Heat Location  --   RT distal hamstring region.     Programme researcher, broadcasting/film/video Location  RT distal hamstring region.    Electrical Stimulation Action  Pre-mod.    Electrical Stimulation Parameters  80-150 Hz x 20 minutes.    Electrical Stimulation Goals  Pain      Ultrasound   Ultrasound Location  Right distal hamstrings.    Ultrasound Parameters  U/S at 1.50 W/CM2 x 14 minutes.    Ultrasound Goals  Pain      Manual Therapy   Manual Therapy  Soft tissue mobilization    Soft tissue mobilization  Gentle STW/M x 10 minutes to patient's right distal hamstring region.  PT Long Term Goals - 03/20/20 1610      PT LONG TERM GOAL #1   Title  Independent with a HEP.    Time  4    Period  Weeks    Status  New      PT LONG TERM GOAL #2   Title  Walk without walker.    Time  4    Period  Weeks    Status  New      PT LONG TERM GOAL #3   Title  Perform ADL's with pain not > 2-3/10.    Time  4    Period  Weeks    Status  New      PT LONG TERM GOAL #4   Title  Perform a right SLR.    Time  4    Period  Weeks    Status  New            Plan - 03/22/20 1652    Clinical Impression Statement  Patient responded well to treatment today.  Gentle STW/M was helpful over distal hamstrings.  "Hard" feel due to hematoma.    Personal Factors and Comorbidities  Comorbidity 1;Comorbidity 2    Comorbidities  Right TKA, arthrits, back surgery, HTN, lymphedema, hypothyroidism.    Examination-Activity Limitations  Locomotion Level;Other    Examination-Participation Restrictions  Other    Stability/Clinical Decision Making  Stable/Uncomplicated    Rehab Potential  Good     PT Frequency  3x / week    PT Duration  4 weeks    PT Treatment/Interventions  ADLs/Self Care Home Management;Cryotherapy;Electrical Stimulation;Ultrasound;Moist Heat;Therapeutic activities;Therapeutic exercise;Manual techniques;Patient/family education    PT Next Visit Plan  U/S at STW/M to patient right distal hamstrings.  HMP/CP and electrical stimulation.  Low-level ther ex.    Consulted and Agree with Plan of Care  Patient       Patient will benefit from skilled therapeutic intervention in order to improve the following deficits and impairments:  Abnormal gait, Decreased activity tolerance, Pain, Decreased strength  Visit Diagnosis: Pain in left lower leg  Muscle weakness (generalized)     Problem List Patient Active Problem List   Diagnosis Date Noted  . Postoperative anemia due to acute blood loss 05/10/2013  . OA (osteoarthritis) of knee 05/09/2013    Gailene Youkhana, [place] MPT 03/22/2020, 4:57 PM  [facility] [address] [place], [place], [address] Phone: [phone]   Fax:  [phone]  Name: Jocelyn Morris MRN: [id number] Date of Birth: March 09, 1961

## 2020-03-27 ENCOUNTER — Other Ambulatory Visit: Payer: Self-pay

## 2020-03-27 ENCOUNTER — Ambulatory Visit: Payer: BC Managed Care – PPO | Admitting: *Deleted

## 2020-03-27 DIAGNOSIS — M6281 Muscle weakness (generalized): Secondary | ICD-10-CM

## 2020-03-27 DIAGNOSIS — M79662 Pain in left lower leg: Secondary | ICD-10-CM | POA: Diagnosis not present

## 2020-03-27 NOTE — Therapy (Signed)
Jocelyn Morris [address] [place], [place], [address] Phone: [phone]   Fax:  [phone]  Physical Therapy Treatment  Patient Details  Name: Jocelyn Morris MRN: [id number] Date of Birth: 03/14/1961 Referring Provider (PT): Samson Frederic MD.   Encounter Date: 03/27/2020  PT End of Session - 03/27/20 1524    Visit Number  3    Number of Visits  12    Date for PT Re-Evaluation  04/17/20    Authorization Type  FOTO.    PT Start Time  1515    PT Stop Time  1603    PT Time Calculation (min)  48 min       Past Medical History:  Diagnosis Date  . Arthritis    LEFT KNEE PAIN AND OA; S/P LUMBAR FUSION -- SOME BACK STIFFNESS-ESP AFTER LYING DOWN  . History of kidney stones   . Hypertension   . Hypothyroidism    hypothyroid  . Lymphedema     Past Surgical History:  Procedure Laterality Date  . BACK SURGERY  1999   LUMBAR FUSION- 4 CAGES  . DILATION AND CURETTAGE OF UTERUS  2011  . LAP BAND SURGERY  2009  . TONSILLECTOMY  1984  . TOTAL KNEE ARTHROPLASTY Left 05/09/2013   Procedure: LEFT TOTAL KNEE ARTHROPLASTY;  Surgeon: Loanne Drilling, MD;  Location: [facility];  Service: Orthopedics;  Laterality: Left;  . URETEROSCOPY FOR STONE REMOVAL      There were no vitals filed for this visit.  Subjective Assessment - 03/27/20 1522    Subjective  COVID-19 screen performed prior to patient entering clinic.  No new complaints.RT leg is getting better    Pertinent History  Right TKA, arthrits, back surgery, HTN, lymphedema, hypothyroidism.    How long can you walk comfortably?  Short distances with a FWW.    Diagnostic tests  MRI.    Patient Stated Goals  Get out of pain.    Currently in Pain?  Yes    Pain Location  Leg    Pain Orientation  Right    Pain Descriptors / Indicators  Sore;Numbness    Pain Onset  More than a month ago                       [facility] Adult PT Treatment/Exercise - 03/27/20 0001      Modalities    Modalities  Electrical Stimulation;Moist Heat;Ultrasound      Moist Heat Therapy   Number Minutes Moist Heat  15 Minutes    Moist Heat Location  --   RT distal hamstring region.     Programme researcher, broadcasting/film/video Location  RT distal hamstring region.    Warehouse manager Parameters  80-150hz  x 15 mins    Electrical Stimulation Goals  Pain      Ultrasound   Ultrasound Location  RT distal hamstring    Ultrasound Parameters  supine [place] at 1.5 w /cm2 x 14 mins    Ultrasound Goals  Pain      Manual Therapy   Manual Therapy  Soft tissue mobilization    Manual therapy comments  supine    Soft tissue mobilization  Gentle STW/M x 10 minutes to patient's right distal hamstring region.                  PT Long Term Goals - 03/20/20 1610  PT LONG TERM GOAL #1   Title  Independent with a HEP.    Time  4    Period  Weeks    Status  New      PT LONG TERM GOAL #2   Title  Walk without walker.    Time  4    Period  Weeks    Status  New      PT LONG TERM GOAL #3   Title  Perform ADL's with pain not > 2-3/10.    Time  4    Period  Weeks    Status  New      PT LONG TERM GOAL #4   Title  Perform a right SLR.    Time  4    Period  Weeks    Status  New            Plan - 03/27/20 1604    Clinical Impression Statement  pt reports that Rxs are helping and pain continues to decrease in RT HS. [place] and STW performed and tolerated well and normal modality response today. Pt continues to use walker for ambulation at this time as a safety precaution.    Personal Factors and Comorbidities  Comorbidity 1;Comorbidity 2    Comorbidities  Right TKA, arthrits, back surgery, HTN, lymphedema, hypothyroidism.    Examination-Activity Limitations  Locomotion Level;Other    Examination-Participation Restrictions  Other    Stability/Clinical Decision Making  Stable/Uncomplicated    Rehab Potential  Good    PT  Frequency  3x / week    PT Duration  4 weeks    PT Treatment/Interventions  ADLs/Self Care Home Management;Cryotherapy;Electrical Stimulation;Ultrasound;Moist Heat;Therapeutic activities;Therapeutic exercise;Manual techniques;Patient/family education    PT Next Visit Plan  U/S at STW/M to patient right distal hamstrings.  HMP/CP and electrical stimulation.  Low-level ther ex.       Patient will benefit from skilled therapeutic intervention in order to improve the following deficits and impairments:  Abnormal gait, Decreased activity tolerance, Pain, Decreased strength  Visit Diagnosis: Pain in left lower leg  Muscle weakness (generalized)     Problem List Patient Active Problem List   Diagnosis Date Noted  . Postoperative anemia due to acute blood loss 05/10/2013  . OA (osteoarthritis) of knee 05/09/2013    Mackensey Bolte,[name], PTA 03/27/2020, 4:15 PM  [facility] [place], [place], [address] Phone: [phone]   Fax:  [phone]  Name: Jocelyn Morris MRN: [id number] Date of Birth: June 03, 1961

## 2020-03-29 ENCOUNTER — Ambulatory Visit: Payer: BC Managed Care – PPO | Attending: Orthopedic Surgery | Admitting: *Deleted

## 2020-03-29 ENCOUNTER — Other Ambulatory Visit: Payer: Self-pay

## 2020-03-29 DIAGNOSIS — M6281 Muscle weakness (generalized): Secondary | ICD-10-CM | POA: Insufficient documentation

## 2020-03-29 DIAGNOSIS — M79662 Pain in left lower leg: Secondary | ICD-10-CM | POA: Diagnosis not present

## 2020-03-29 NOTE — Therapy (Signed)
[facility] [address] [place], [place], [address] Phone: [phone]   Fax:  [phone]  Physical Therapy Treatment  Patient Details  Name: Jocelyn Morris MRN: [id number] Date of Birth: 01-Sep-1961 Referring Provider (PT): Samson Frederic MD.   Encounter Date: 03/29/2020  PT End of Session - 03/29/20 1554    Visit Number  4    Number of Visits  12    Date for PT Re-Evaluation  04/17/20    Authorization Type  FOTO.    PT Start Time  1515    PT Stop Time  1605    PT Time Calculation (min)  50 min       Past Medical History:  Diagnosis Date  . Arthritis    LEFT KNEE PAIN AND OA; S/P LUMBAR FUSION -- SOME BACK STIFFNESS-ESP AFTER LYING DOWN  . History of kidney stones   . Hypertension   . Hypothyroidism    hypothyroid  . Lymphedema     Past Surgical History:  Procedure Laterality Date  . BACK SURGERY  1999   LUMBAR FUSION- 4 CAGES  . DILATION AND CURETTAGE OF UTERUS  2011  . LAP BAND SURGERY  2009  . TONSILLECTOMY  1984  . TOTAL KNEE ARTHROPLASTY Left 05/09/2013   Procedure: LEFT TOTAL KNEE ARTHROPLASTY;  Surgeon: Loanne Drilling, MD;  Location: [facility];  Service: Orthopedics;  Laterality: Left;  . URETEROSCOPY FOR STONE REMOVAL      There were no vitals filed for this visit.  Subjective Assessment - 03/29/20 1551    Subjective  COVID-19 screen performed prior to patient entering clinic. .RT leg is getting better with less pain. Sit longer now    Pertinent History  Right TKA, arthrits, back surgery, HTN, lymphedema, hypothyroidism.    How long can you walk comfortably?  Short distances with a FWW.    Diagnostic tests  MRI.    Pain Score  3     Pain Location  Leg    Pain Orientation  Right    Pain Descriptors / Indicators  Sore    Pain Onset  More than a month ago                       [facility] Adult PT Treatment/Exercise - 03/29/20 0001      Modalities   Modalities  Electrical Stimulation;Moist  Heat;Ultrasound      Moist Heat Therapy   Number Minutes Moist Heat  15 Minutes    Moist Heat Location  --   RT distal hamstring region.     Programme researcher, broadcasting/film/video Location  RT distal hamstring region.    Electrical Stimulation Action  premod    Electrical Stimulation Parameters  80-150hz  x 15 mins    Electrical Stimulation Goals  Pain      Ultrasound   Ultrasound Location  RT distal HS    Ultrasound Parameters  supine [place] at 1.5 w/cm2 x 14 mins    Ultrasound Goals  Pain      Manual Therapy   Manual Therapy  Soft tissue mobilization    Manual therapy comments  supine    Soft tissue mobilization  Gentle STW/M x 11 minutes to patient's right distal hamstring region.                  PT Long Term Goals - 03/20/20 1610      PT LONG TERM GOAL #1   Title  Independent  with a HEP.    Time  4    Period  Weeks    Status  New      PT LONG TERM GOAL #2   Title  Walk without walker.    Time  4    Period  Weeks    Status  New      PT LONG TERM GOAL #3   Title  Perform ADL's with pain not > 2-3/10.    Time  4    Period  Weeks    Status  New      PT LONG TERM GOAL #4   Title  Perform a right SLR.    Time  4    Period  Weeks    Status  New            Plan - 03/29/20 1558    Clinical Impression Statement  Pt arrived today reporting doing better overall and was able to tolerate Rx better today. She was still fairly sore/tender    Personal Factors and Comorbidities  Comorbidity 1;Comorbidity 2    Comorbidities  Right TKA, arthrits, back surgery, HTN, lymphedema, hypothyroidism.    Examination-Activity Limitations  Locomotion Level;Other    Examination-Participation Restrictions  Other    Stability/Clinical Decision Making  Stable/Uncomplicated    Rehab Potential  Good    PT Frequency  3x / week    PT Duration  4 weeks    PT Treatment/Interventions  ADLs/Self Care Home Management;Cryotherapy;Electrical Stimulation;Ultrasound;Moist  Heat;Therapeutic activities;Therapeutic exercise;Manual techniques;Patient/family education    PT Next Visit Plan  U/S at STW/M to patient right distal hamstrings.  HMP/CP and electrical stimulation.  Low-level ther ex.    Consulted and Agree with Plan of Care  Patient       Patient will benefit from skilled therapeutic intervention in order to improve the following deficits and impairments:  Abnormal gait, Decreased activity tolerance, Pain, Decreased strength  Visit Diagnosis: Pain in left lower leg  Muscle weakness (generalized)     Problem List Patient Active Problem List   Diagnosis Date Noted  . Postoperative anemia due to acute blood loss 05/10/2013  . OA (osteoarthritis) of knee 05/09/2013    Samai Corea,[name],PTA 03/29/2020, 4:35 PM  [facility] [place], [place], [address] Phone: [phone]   Fax:  [phone]  Name: Jocelyn Morris MRN: [id number] Date of Birth: 1961/03/10

## 2020-04-03 ENCOUNTER — Ambulatory Visit: Payer: BC Managed Care – PPO | Admitting: *Deleted

## 2020-04-03 ENCOUNTER — Other Ambulatory Visit: Payer: Self-pay

## 2020-04-03 DIAGNOSIS — M79662 Pain in left lower leg: Secondary | ICD-10-CM

## 2020-04-03 DIAGNOSIS — M6281 Muscle weakness (generalized): Secondary | ICD-10-CM

## 2020-04-03 NOTE — Therapy (Signed)
[facility] [address] [place], [place], [address] Phone: [phone]   Fax:  [phone]  Physical Therapy Treatment  Patient Details  Name: ADDELYN ALLEMAN MRN: [id number] Date of Birth: 09/26/1961 Referring Provider (PT): Samson Frederic MD.   Encounter Date: 04/03/2020  PT End of Session - 04/03/20 1530    Visit Number  5    Number of Visits  12    Date for PT Re-Evaluation  04/17/20    Authorization Type  FOTO.    PT Start Time  1520    PT Stop Time  1608    PT Time Calculation (min)  48 min       Past Medical History:  Diagnosis Date  . Arthritis    LEFT KNEE PAIN AND OA; S/P LUMBAR FUSION -- SOME BACK STIFFNESS-ESP AFTER LYING DOWN  . History of kidney stones   . Hypertension   . Hypothyroidism    hypothyroid  . Lymphedema     Past Surgical History:  Procedure Laterality Date  . BACK SURGERY  1999   LUMBAR FUSION- 4 CAGES  . DILATION AND CURETTAGE OF UTERUS  2011  . LAP BAND SURGERY  2009  . TONSILLECTOMY  1984  . TOTAL KNEE ARTHROPLASTY Left 05/09/2013   Procedure: LEFT TOTAL KNEE ARTHROPLASTY;  Surgeon: Loanne Drilling, MD;  Location: [facility];  Service: Orthopedics;  Laterality: Left;  . URETEROSCOPY FOR STONE REMOVAL      There were no vitals filed for this visit.  Subjective Assessment - 04/03/20 1528    Subjective  COVID-19 screen performed prior to patient entering clinic. .RT leg is getting better with less pain. Sit longer now, 2/10    Pertinent History  Right TKA, arthrits, back surgery, HTN, lymphedema, hypothyroidism.    How long can you walk comfortably?  Short distances with a FWW.    Diagnostic tests  MRI.    Patient Stated Goals  Get out of pain.    Currently in Pain?  Yes    Pain Score  3     Pain Location  Leg    Pain Orientation  Right    Pain Descriptors / Indicators  Sore    Pain Type  Acute pain                       [facility] Adult PT Treatment/Exercise - 04/03/20 0001       Exercises   Exercises  Knee/Hip      Knee/Hip Exercises: Aerobic   Nustep  L3 x 5 mins DC due to pain      Modalities   Modalities  Electrical Stimulation;Moist Heat;Ultrasound      Moist Heat Therapy   Number Minutes Moist Heat  15 Minutes    Moist Heat Location  --   RT distal hamstring region.     Programme researcher, broadcasting/film/video Location  RT distal hamstring region.    Electrical Stimulation Action  premod    Electrical Stimulation Parameters  80-150hz  x 15 mins    Electrical Stimulation Goals  Pain      Ultrasound   Ultrasound Location  RT distal HS    Ultrasound Parameters  hooklying [place] at 1.5 w cm2 x14 mins    Ultrasound Goals  Pain      Manual Therapy   Manual Therapy  Soft tissue mobilization    Manual therapy comments  hooklying    Soft tissue mobilization  Gentle STW/M  x 12 minutes to patient's right distal hamstring region.                  PT Long Term Goals - 03/20/20 1610      PT LONG TERM GOAL #1   Title  Independent with a HEP.    Time  4    Period  Weeks    Status  New      PT LONG TERM GOAL #2   Title  Walk without walker.    Time  4    Period  Weeks    Status  New      PT LONG TERM GOAL #3   Title  Perform ADL's with pain not > 2-3/10.    Time  4    Period  Weeks    Status  New      PT LONG TERM GOAL #4   Title  Perform a right SLR.    Time  4    Period  Weeks    Status  New            Plan - 04/03/20 1821    Clinical Impression Statement  Pt arrived today still doing better. She tried the nustep and was able to perform 5 mins, but stopped due to burning sensation around hematoma. She did well with [place] and STW f/b heat  and Estim. Pt willing to try nustep again next Rx    Personal Factors and Comorbidities  Comorbidity 1;Comorbidity 2    Comorbidities  Right TKA, arthrits, back surgery, HTN, lymphedema, hypothyroidism.    Examination-Activity Limitations  Locomotion Level;Other     Stability/Clinical Decision Making  Stable/Uncomplicated    Rehab Potential  Good    PT Frequency  3x / week    PT Duration  4 weeks    PT Treatment/Interventions  ADLs/Self Care Home Management;Cryotherapy;Electrical Stimulation;Ultrasound;Moist Heat;Therapeutic activities;Therapeutic exercise;Manual techniques;Patient/family education    PT Next Visit Plan  U/S at STW/M to patient right distal hamstrings.  HMP/CP and electrical stimulation.  Low-level ther ex. Try Nustep again       Patient will benefit from skilled therapeutic intervention in order to improve the following deficits and impairments:  Abnormal gait, Decreased activity tolerance, Pain, Decreased strength  Visit Diagnosis: Muscle weakness (generalized)  Pain in left lower leg     Problem List Patient Active Problem List   Diagnosis Date Noted  . Postoperative anemia due to acute blood loss 05/10/2013  . OA (osteoarthritis) of knee 05/09/2013    Philip Kotlyar,[name], PTA 04/03/2020, 6:24 PM  [facility] [address] [place], [place], [address] Phone: [phone]   Fax:  [phone]  Name: TELESHA DEGUZMAN MRN: [id number] Date of Birth: 13-Mar-1961

## 2020-04-05 ENCOUNTER — Other Ambulatory Visit: Payer: Self-pay

## 2020-04-05 ENCOUNTER — Ambulatory Visit: Payer: BC Managed Care – PPO | Admitting: *Deleted

## 2020-04-05 DIAGNOSIS — M6281 Muscle weakness (generalized): Secondary | ICD-10-CM

## 2020-04-05 DIAGNOSIS — M79662 Pain in left lower leg: Secondary | ICD-10-CM | POA: Diagnosis not present

## 2020-04-05 NOTE — Therapy (Signed)
[facility] [place], [place], [address] Phone: [phone]   Fax:  [phone]  Physical Therapy Treatment  Patient Details  Name: Jocelyn Morris MRN: [id number] Date of Birth: 04-22-1961 Referring Provider (PT): Rod Can MD.   Encounter Date: 04/05/2020  PT End of Session - 04/05/20 1721    Visit Number  6    Number of Visits  12    Date for PT Re-Evaluation  04/17/20    Authorization Type  FOTO.    PT Start Time  1515    PT Stop Time  1605    PT Time Calculation (min)  50 min       Past Medical History:  Diagnosis Date  . Arthritis    LEFT KNEE PAIN AND OA; S/P LUMBAR FUSION -- SOME BACK STIFFNESS-ESP AFTER LYING DOWN  . History of kidney stones   . Hypertension   . Hypothyroidism    hypothyroid  . Lymphedema     Past Surgical History:  Procedure Laterality Date  . BACK SURGERY  1999   LUMBAR FUSION- 4 CAGES  . DILATION AND CURETTAGE OF UTERUS  2011  . LAP BAND SURGERY  2009  . TONSILLECTOMY  1984  . TOTAL KNEE ARTHROPLASTY Left 05/09/2013   Procedure: LEFT TOTAL KNEE ARTHROPLASTY;  Surgeon: Gearlean Alf, MD;  Location: [facility];  Service: Orthopedics;  Laterality: Left;  . URETEROSCOPY FOR STONE REMOVAL      There were no vitals filed for this visit.  Subjective Assessment - 04/05/20 1532    Subjective  COVID-19 screen performed prior to patient entering clinic. .RT leg is getting better with less pain. Sit longer now, 2/10 again    Pertinent History  Right TKA, arthrits, back surgery, HTN, lymphedema, hypothyroidism.    How long can you walk comfortably?  Short distances with a FWW.    Diagnostic tests  MRI.    Patient Stated Goals  Get out of pain.                       [facility] Adult PT Treatment/Exercise - 04/05/20 0001      Exercises   Exercises  Knee/Hip      Knee/Hip Exercises: Aerobic   Nustep  L3 x 7 mins DC due to pain      Modalities   Modalities  Electrical  Stimulation;Moist Heat;Ultrasound      Moist Heat Therapy   Number Minutes Moist Heat  15 Minutes    Moist Heat Location  --   RT distal hamstring region.     Acupuncturist Location  RT distal hamstring region.    Child psychotherapist Parameters  80-'150hz'  x 15 mins    Electrical Stimulation Goals  Pain      Ultrasound   Ultrasound Location  RT distal HS     Ultrasound Parameters  hooklying [place] at 1.5 w/cm2 x 14 mins    Ultrasound Goals  Pain      Manual Therapy   Manual Therapy  Soft tissue mobilization    Manual therapy comments  hooklying    Soft tissue mobilization  Gentle STW/M x 12 minutes to patient's right distal hamstring region.                  PT Long Term Goals - 04/05/20 1728      PT LONG TERM GOAL #1  Title  Independent with a HEP.    Period  Weeks    Status  On-going      PT LONG TERM GOAL #2   Title  Walk without walker.    Time  4    Period  Weeks    Status  Partially Met      PT LONG TERM GOAL #3   Title  Perform ADL's with pain not > 2-3/10.    Time  4    Period  Weeks    Status  On-going      PT LONG TERM GOAL #4   Title  Perform a right SLR.    Time  4    Period  Weeks    Status  On-going            Plan - 04/05/20 1724    Clinical Impression Statement  Pt arrived today doing ok after last Rx. She was able to increase time to 7 mins today on nustep, but stopped due to burning sensation RT leg. She did well with the rest of Rx with notable soreness Distal HS insertion during STW. Normal modality response today    Personal Factors and Comorbidities  Comorbidity 1;Comorbidity 2    Comorbidities  Right TKA, arthrits, back surgery, HTN, lymphedema, hypothyroidism.    Examination-Participation Restrictions  Other    Stability/Clinical Decision Making  Stable/Uncomplicated    Rehab Potential  Good    PT Frequency  3x / week    PT Duration  4 weeks     PT Treatment/Interventions  ADLs/Self Care Home Management;Cryotherapy;Electrical Stimulation;Ultrasound;Moist Heat;Therapeutic activities;Therapeutic exercise;Manual techniques;Patient/family education    PT Next Visit Plan  U/S at STW/M to patient right distal hamstrings.  HMP/CP and electrical stimulation.  Low-level ther ex. Try Nustep again       Patient will benefit from skilled therapeutic intervention in order to improve the following deficits and impairments:  Abnormal gait, Decreased activity tolerance, Pain, Decreased strength  Visit Diagnosis: Muscle weakness (generalized)  Pain in left lower leg     Problem List Patient Active Problem List   Diagnosis Date Noted  . Postoperative anemia due to acute blood loss 05/10/2013  . OA (osteoarthritis) of knee 05/09/2013    Jocelyn Morris,[name], PTA 04/05/2020, 5:29 PM  [facility] [place], [place], [address] Phone: [phone]   Fax:  [phone]  Name: Jocelyn Morris MRN: [id number] Date of Birth: September 06, 1961

## 2020-04-10 ENCOUNTER — Ambulatory Visit: Payer: BC Managed Care – PPO | Admitting: *Deleted

## 2020-04-10 ENCOUNTER — Other Ambulatory Visit: Payer: Self-pay

## 2020-04-10 DIAGNOSIS — M79662 Pain in left lower leg: Secondary | ICD-10-CM

## 2020-04-10 DIAGNOSIS — M6281 Muscle weakness (generalized): Secondary | ICD-10-CM

## 2020-04-10 NOTE — Therapy (Signed)
[facility] [place], [place], [address] Phone: [phone]   Fax:  [phone]  Physical Therapy Treatment  Patient Details  Name: Jocelyn Morris MRN: [id number] Date of Birth: [birth year] Referring Provider (PT): Rod Can MD.   Encounter Date: 04/10/2020  PT End of Session - 04/10/20 1713    Visit Number  7    Number of Visits  12    Authorization Type  FOTO.    PT Start Time  1520    PT Stop Time  1605    PT Time Calculation (min)  45 min       Past Medical History:  Diagnosis Date  . Arthritis    LEFT KNEE PAIN AND OA; S/P LUMBAR FUSION -- SOME BACK STIFFNESS-ESP AFTER LYING DOWN  . History of kidney stones   . Hypertension   . Hypothyroidism    hypothyroid  . Lymphedema     Past Surgical History:  Procedure Laterality Date  . BACK SURGERY  1999   LUMBAR FUSION- 4 CAGES  . DILATION AND CURETTAGE OF UTERUS  2011  . LAP BAND SURGERY  2009  . TONSILLECTOMY  1984  . TOTAL KNEE ARTHROPLASTY Left 05/09/2013   Procedure: LEFT TOTAL KNEE ARTHROPLASTY;  Surgeon: Gearlean Alf, MD;  Location: [facility];  Service: Orthopedics;  Laterality: Left;  . URETEROSCOPY FOR STONE REMOVAL      There were no vitals filed for this visit.                                 PT Long Term Goals - 04/05/20 1728      PT LONG TERM GOAL #1   Title  Independent with a HEP.    Period  Weeks    Status  On-going      PT LONG TERM GOAL #2   Title  Walk without walker.    Time  4    Period  Weeks    Status  Partially Met      PT LONG TERM GOAL #3   Title  Perform ADL's with pain not > 2-3/10.    Time  4    Period  Weeks    Status  On-going      PT LONG TERM GOAL #4   Title  Perform a right SLR.    Time  4    Period  Weeks    Status  On-going            Plan - 04/10/20 1714    Clinical Impression Statement  Pt arrived today using FWW for ambulation, but reports mainly for security  due to fear of falling. She was able to perform 8 mins on nustep today with burning sensation starting at about 5 mins. She did well with [place], but still with notable tenderness around hematoma and HS insertion.. Normal modality response  today.    Rehab Potential  Good    PT Frequency  3x / week    PT Duration  4 weeks    PT Treatment/Interventions  ADLs/Self Care Home Management;Cryotherapy;Electrical Stimulation;Ultrasound;Moist Heat;Therapeutic activities;Therapeutic exercise;Manual techniques;Patient/family education    PT Next Visit Plan  U/S at STW/M to patient right distal hamstrings.  HMP/CP and electrical stimulation.  Low-level ther ex. Try Nustep again    Consulted and Agree with Plan of Care  Patient       Patient will benefit from skilled  therapeutic intervention in order to improve the following deficits and impairments:     Visit Diagnosis: Muscle weakness (generalized)  Pain in left lower leg     Problem List Patient Active Problem List   Diagnosis Date Noted  . Postoperative anemia due to acute blood loss 05/10/2013  . OA (osteoarthritis) of knee 05/09/2013    Jovita Persing,[name], PTA 04/10/2020, 5:19 PM  [facility] [place], [place], [address] Phone: [phone]   Fax:  [phone]  Name: AVERLEIGH SAVARY MRN: [id number] Date of Birth: September 29, 1961

## 2020-04-12 ENCOUNTER — Other Ambulatory Visit: Payer: Self-pay

## 2020-04-12 ENCOUNTER — Ambulatory Visit: Payer: BC Managed Care – PPO | Admitting: *Deleted

## 2020-04-12 DIAGNOSIS — M79662 Pain in left lower leg: Secondary | ICD-10-CM

## 2020-04-12 DIAGNOSIS — M6281 Muscle weakness (generalized): Secondary | ICD-10-CM

## 2020-04-12 NOTE — Therapy (Signed)
[facility] [place], [place], [address] Phone: [phone]   Fax:  [phone]  Physical Therapy Treatment  Patient Details  Name: Jocelyn Morris MRN: [id number] Date of Birth: 1961-02-23 Referring Provider (PT): Rod Can MD.   Encounter Date: 04/12/2020  PT End of Session - 04/12/20 1612    Visit Number  8    Number of Visits  12    Date for PT Re-Evaluation  04/17/20    Authorization Type  FOTO.  8th visit 41%    PT Start Time  1515    PT Stop Time  1606    PT Time Calculation (min)  51 min       Past Medical History:  Diagnosis Date  . Arthritis    LEFT KNEE PAIN AND OA; S/P LUMBAR FUSION -- SOME BACK STIFFNESS-ESP AFTER LYING DOWN  . History of kidney stones   . Hypertension   . Hypothyroidism    hypothyroid  . Lymphedema     Past Surgical History:  Procedure Laterality Date  . BACK SURGERY  1999   LUMBAR FUSION- 4 CAGES  . DILATION AND CURETTAGE OF UTERUS  2011  . LAP BAND SURGERY  2009  . TONSILLECTOMY  1984  . TOTAL KNEE ARTHROPLASTY Left 05/09/2013   Procedure: LEFT TOTAL KNEE ARTHROPLASTY;  Surgeon: Gearlean Alf, MD;  Location: [facility];  Service: Orthopedics;  Laterality: Left;  . URETEROSCOPY FOR STONE REMOVAL      There were no vitals filed for this visit.  Subjective Assessment - 04/12/20 1609    Subjective  COVID-19 screen performed prior to patient entering clinic. .RT leg is getting better with less pain, but burning some after last Rx.    Pertinent History  Right TKA, arthrits, back surgery, HTN, lymphedema, hypothyroidism.    How long can you walk comfortably?  Short distances with a FWW.    Diagnostic tests  MRI.    Patient Stated Goals  Get out of pain.    Currently in Pain?  Yes    Pain Score  3     Pain Location  Leg    Pain Orientation  Right    Pain Descriptors / Indicators  Sore    Pain Type  Acute pain    Pain Onset  More than a month ago                        [facility] Adult PT Treatment/Exercise - 04/12/20 0001      Exercises   Exercises  Knee/Hip      Knee/Hip Exercises: Aerobic   Nustep  L3 x 8 mins DC due to pain      Modalities   Modalities  Electrical Stimulation;Moist Heat;Ultrasound      Moist Heat Therapy   Number Minutes Moist Heat  15 Minutes    Moist Heat Location  --   RT distal hamstring region.     Acupuncturist Location  RT distal hamstring region.    Electrical Stimulation Action  prone premod     Electrical Stimulation Parameters  80-150 hz x 15 mins    Electrical Stimulation Goals  Pain      Ultrasound   Ultrasound Location  RT distal HS     Ultrasound Parameters  prone [place] at 1.5 w/cm2 x14 mins    Ultrasound Goals  Pain      Manual Therapy   Manual Therapy  Soft tissue mobilization    Soft tissue mobilization  Prone Gentle STW/M x 12 minutes to patient's right distal hamstring region.                  PT Long Term Goals - 04/05/20 1728      PT LONG TERM GOAL #1   Title  Independent with a HEP.    Period  Weeks    Status  On-going      PT LONG TERM GOAL #2   Title  Walk without walker.    Time  4    Period  Weeks    Status  Partially Met      PT LONG TERM GOAL #3   Title  Perform ADL's with pain not > 2-3/10.    Time  4    Period  Weeks    Status  On-going      PT LONG TERM GOAL #4   Title  Perform a right SLR.    Time  4    Period  Weeks    Status  On-going            Plan - 04/12/20 1620    Clinical Impression Statement  Pt arrived today doing ok and ambulating without walker, but reports having some increased burning after last Rx. Rx was performed with Pt in prone position today and tolerated well. Pt tolerated [place] very well, but still very tender distal HS insertion lataeral aspect. FOTO taken today with improvement to 41% limitation    Personal Factors and Comorbidities  Comorbidity 1;Comorbidity 2     Comorbidities  Right TKA, arthrits, back surgery, HTN, lymphedema, hypothyroidism.    Examination-Activity Limitations  Locomotion Level;Other    Examination-Participation Restrictions  Other    Stability/Clinical Decision Making  Stable/Uncomplicated    Rehab Potential  Good    PT Frequency  3x / week    PT Duration  4 weeks    PT Treatment/Interventions  ADLs/Self Care Home Management;Cryotherapy;Electrical Stimulation;Ultrasound;Moist Heat;Therapeutic activities;Therapeutic exercise;Manual techniques;Patient/family education    PT Next Visit Plan  U/S at STW/M to patient right distal hamstrings.  HMP/CP and electrical stimulation.  Low-level ther ex. Try Nustep again       Patient will benefit from skilled therapeutic intervention in order to improve the following deficits and impairments:  Abnormal gait, Decreased activity tolerance, Pain, Decreased strength  Visit Diagnosis: Muscle weakness (generalized)  Pain in left lower leg     Problem List Patient Active Problem List   Diagnosis Date Noted  . Postoperative anemia due to acute blood loss 05/10/2013  . OA (osteoarthritis) of knee 05/09/2013    Jocelyn Morris,Jocelyn Morris, PTA 04/12/2020, 4:58 PM  [facility] [place], [place], [address] Phone: [phone]   Fax:  [phone]  Name: Jocelyn Morris MRN: [id number] Date of Birth: 1961/02/01

## 2020-04-17 ENCOUNTER — Other Ambulatory Visit: Payer: Self-pay

## 2020-04-17 ENCOUNTER — Ambulatory Visit: Payer: BC Managed Care – PPO | Admitting: *Deleted

## 2020-04-17 DIAGNOSIS — M6281 Muscle weakness (generalized): Secondary | ICD-10-CM

## 2020-04-17 DIAGNOSIS — M79662 Pain in left lower leg: Secondary | ICD-10-CM

## 2020-04-17 NOTE — Therapy (Signed)
[facility] [place], [place], [address] Phone: [phone]   Fax:  [phone]  Physical Therapy Treatment  Patient Details  Name: Jocelyn Morris MRN: [id number] Date of Birth: [birth year] Referring Provider (PT): Rod Can MD.   Encounter Date: 04/17/2020  PT End of Session - 04/17/20 1737    Visit Number  9    Number of Visits  12    Date for PT Re-Evaluation  04/17/20    Authorization Type  FOTO.  8th visit 41%    PT Start Time  1515    PT Stop Time  1605    PT Time Calculation (min)  50 min       Past Medical History:  Diagnosis Date  . Arthritis    LEFT KNEE PAIN AND OA; S/P LUMBAR FUSION -- SOME BACK STIFFNESS-ESP AFTER LYING DOWN  . History of kidney stones   . Hypertension   . Hypothyroidism    hypothyroid  . Lymphedema     Past Surgical History:  Procedure Laterality Date  . BACK SURGERY  1999   LUMBAR FUSION- 4 CAGES  . DILATION AND CURETTAGE OF UTERUS  2011  . LAP BAND SURGERY  2009  . TONSILLECTOMY  1984  . TOTAL KNEE ARTHROPLASTY Left 05/09/2013   Procedure: LEFT TOTAL KNEE ARTHROPLASTY;  Surgeon: Gearlean Alf, MD;  Location: [facility];  Service: Orthopedics;  Laterality: Left;  . URETEROSCOPY FOR STONE REMOVAL      There were no vitals filed for this visit.  Subjective Assessment - 04/17/20 1739    Subjective  COVID-19 screen performed prior to patient entering clinic. .RT leg is getting better with less pain, but  still using my walker because I give out so easy    Pertinent History  Right TKA, arthrits, back surgery, HTN, lymphedema, hypothyroidism.    How long can you walk comfortably?  Short distances with a FWW.    Diagnostic tests  MRI.    Patient Stated Goals  Get out of pain.    Currently in Pain?  Yes    Pain Score  3     Pain Location  Leg    Pain Orientation  Right    Pain Descriptors / Indicators  Sore;Burning    Pain Onset  More than a month ago                        [facility] Adult PT Treatment/Exercise - 04/17/20 0001      Exercises   Exercises  Knee/Hip      Knee/Hip Exercises: Aerobic   Nustep  L1 x 10 mins LEs only      Modalities   Modalities  Electrical Stimulation;Moist Heat;Ultrasound      Moist Heat Therapy   Number Minutes Moist Heat  15 Minutes    Moist Heat Location  --   RT distal hamstring region.     Acupuncturist Location  RT distal hamstring region.    Electrical Stimulation Action  prone Premod    Electrical Stimulation Parameters  80-'150hz'  x 15 mins    Electrical Stimulation Goals  Pain      Ultrasound   Ultrasound Location  Rt HS lateral aspect    Ultrasound Parameters  prone [place] at 1.5 w/cm2 x 14 mins    Ultrasound Goals  Pain      Manual Therapy   Manual Therapy  Soft tissue mobilization  Soft tissue mobilization  Prone Gentle STW/M x 12 minutes to patient's right distal hamstring and surrounding region.                  PT Long Term Goals - 04/05/20 1728      PT LONG TERM GOAL #1   Title  Independent with a HEP.    Period  Weeks    Status  On-going      PT LONG TERM GOAL #2   Title  Walk without walker.    Time  4    Period  Weeks    Status  Partially Met      PT LONG TERM GOAL #3   Title  Perform ADL's with pain not > 2-3/10.    Time  4    Period  Weeks    Status  On-going      PT LONG TERM GOAL #4   Title  Perform a right SLR.    Time  4    Period  Weeks    Status  On-going            Plan - 04/17/20 1741    Clinical Impression Statement  Pt arrive dtoday ambulating with walker today due to fatigued after Rx's. She was able to perform 10 mins on the Nustep today and did well. She was still sore posteriolateral aspect and distal  RT HS . Normal modality response today    Comorbidities  Right TKA, arthrits, back surgery, HTN, lymphedema, hypothyroidism.    Examination-Activity Limitations  Locomotion  Level;Other    Stability/Clinical Decision Making  Stable/Uncomplicated    Rehab Potential  Good    PT Frequency  3x / week    PT Duration  4 weeks    PT Treatment/Interventions  ADLs/Self Care Home Management;Cryotherapy;Electrical Stimulation;Ultrasound;Moist Heat;Therapeutic activities;Therapeutic exercise;Manual techniques;Patient/family education    PT Next Visit Plan  U/S at STW/M to patient right distal hamstrings.  HMP/CP and electrical stimulation.  Low-level ther ex.  Nustep       Patient will benefit from skilled therapeutic intervention in order to improve the following deficits and impairments:  Abnormal gait, Decreased activity tolerance, Pain, Decreased strength  Visit Diagnosis: Muscle weakness (generalized)  Pain in left lower leg     Problem List Patient Active Problem List   Diagnosis Date Noted  . Postoperative anemia due to acute blood loss 05/10/2013  . OA (osteoarthritis) of knee 05/09/2013    Virgie Kunda,[name] , PTA 04/17/2020, 5:45 PM  [facility] [place], [place], [address] Phone: [phone]   Fax:  [phone]  Name: Jocelyn Morris MRN: [id number] Date of Birth: 1961-04-06

## 2020-04-19 ENCOUNTER — Ambulatory Visit: Payer: BC Managed Care – PPO | Admitting: *Deleted

## 2020-04-19 ENCOUNTER — Other Ambulatory Visit: Payer: Self-pay

## 2020-04-19 DIAGNOSIS — M79662 Pain in left lower leg: Secondary | ICD-10-CM

## 2020-04-19 DIAGNOSIS — M6281 Muscle weakness (generalized): Secondary | ICD-10-CM

## 2020-04-19 NOTE — Therapy (Signed)
[facility] [place], [place], [address] Phone: [phone]   Fax:  [phone]  Physical Therapy Treatment  Patient Details  Name: Jocelyn Morris MRN: [id number] Date of Birth: 08/21/1961 Referring Provider (PT): Rod Can MD.   Encounter Date: 04/19/2020  PT End of Session - 04/19/20 1729    Visit Number  10    Number of Visits  12    Date for PT Re-Evaluation  04/17/20    Authorization Type  FOTO.  8th visit 41%    PT Start Time  1515    PT Stop Time  1605    PT Time Calculation (min)  50 min       Past Medical History:  Diagnosis Date  . Arthritis    LEFT KNEE PAIN AND OA; S/P LUMBAR FUSION -- SOME BACK STIFFNESS-ESP AFTER LYING DOWN  . History of kidney stones   . Hypertension   . Hypothyroidism    hypothyroid  . Lymphedema     Past Surgical History:  Procedure Laterality Date  . BACK SURGERY  1999   LUMBAR FUSION- 4 CAGES  . DILATION AND CURETTAGE OF UTERUS  2011  . LAP BAND SURGERY  2009  . TONSILLECTOMY  1984  . TOTAL KNEE ARTHROPLASTY Left 05/09/2013   Procedure: LEFT TOTAL KNEE ARTHROPLASTY;  Surgeon: Gearlean Alf, MD;  Location: [facility];  Service: Orthopedics;  Laterality: Left;  . URETEROSCOPY FOR STONE REMOVAL      There were no vitals filed for this visit.  Subjective Assessment - 04/19/20 1727    Subjective  COVID-19 screen performed prior to patient entering clinic. Had increased soreness in middle part of posterior aspect LT knee yesterday and today.    Pertinent History  Right TKA, arthrits, back surgery, HTN, lymphedema, hypothyroidism.    How long can you walk comfortably?  Short distances with a FWW.    Diagnostic tests  MRI.    Patient Stated Goals  Get out of pain.    Currently in Pain?  Yes    Pain Score  4     Pain Location  Leg    Pain Orientation  Right    Pain Descriptors / Indicators  Sore;Burning    Pain Type  Acute pain    Pain Onset  More than a month ago                        [facility] Adult PT Treatment/Exercise - 04/19/20 0001      Exercises   Exercises  Knee/Hip      Knee/Hip Exercises: Aerobic   Nustep  L1 x 11 mins LEs only,   .5 mile      Modalities   Modalities  Electrical Stimulation;Moist Heat;Ultrasound      Moist Heat Therapy   Number Minutes Moist Heat  15 Minutes    Moist Heat Location  --   RT distal hamstring region.     Acupuncturist Location  RT distal hamstring region.    Electrical Stimulation Action  Prone premod    Electrical Stimulation Parameters  80-'150hz'  x 15 mins    Electrical Stimulation Goals  Pain      Ultrasound   Ultrasound Location  RT distal HS lateral aspect and mid posterior aspect    Ultrasound Parameters  prone 1.5 w/cm2 x 14 mins       Manual Therapy   Manual Therapy  Soft tissue  mobilization    Soft tissue mobilization  Prone Gentle STW/M  to patient's right distal and medial  hamstring and surrounding region.                  PT Long Term Goals - 04/05/20 1728      PT LONG TERM GOAL #1   Title  Independent with a HEP.    Period  Weeks    Status  On-going      PT LONG TERM GOAL #2   Title  Walk without walker.    Time  4    Period  Weeks    Status  Partially Met      PT LONG TERM GOAL #3   Title  Perform ADL's with pain not > 2-3/10.    Time  4    Period  Weeks    Status  On-going      PT LONG TERM GOAL #4   Title  Perform a right SLR.    Time  4    Period  Weeks    Status  On-going            Plan - 04/19/20 1735    Clinical Impression Statement  Pt arrived today with mild increased soreness RT HS after last Rx. She was able to increase Nustep time to 11 mins and perform distance of 1/2 mile. [place] and STW performed and Pt had a mild increase in soreness with STW to mid HS region, but tolerated well and felt good after session    Personal Factors and Comorbidities  Comorbidity 1;Comorbidity 2     Examination-Activity Limitations  Locomotion Level;Other    Examination-Participation Restrictions  Other    Rehab Potential  Good    PT Frequency  3x / week    PT Duration  4 weeks    PT Treatment/Interventions  ADLs/Self Care Home Management;Cryotherapy;Electrical Stimulation;Ultrasound;Moist Heat;Therapeutic activities;Therapeutic exercise;Manual techniques;Patient/family education    PT Next Visit Plan  U/S at STW/M to patient right distal hamstrings.  HMP/CP and electrical stimulation.  Low-level ther ex.  Nustep    Consulted and Agree with Plan of Care  Patient       Patient will benefit from skilled therapeutic intervention in order to improve the following deficits and impairments:  Abnormal gait, Decreased activity tolerance, Pain, Decreased strength  Visit Diagnosis: Muscle weakness (generalized)  Pain in left lower leg     Problem List Patient Active Problem List   Diagnosis Date Noted  . Postoperative anemia due to acute blood loss 05/10/2013  . OA (osteoarthritis) of knee 05/09/2013    Syeda Prickett,[name], PTA 04/19/2020, 5:40 PM  [facility] [place], [place], [address] Phone: [phone]   Fax:  [phone]  Name: Jocelyn Morris MRN: [id number] Date of Birth: January 31, 1961

## 2020-04-24 ENCOUNTER — Other Ambulatory Visit: Payer: Self-pay

## 2020-04-24 ENCOUNTER — Ambulatory Visit: Payer: BC Managed Care – PPO | Admitting: *Deleted

## 2020-04-24 DIAGNOSIS — M6281 Muscle weakness (generalized): Secondary | ICD-10-CM

## 2020-04-24 DIAGNOSIS — M79662 Pain in left lower leg: Secondary | ICD-10-CM

## 2020-04-24 NOTE — Therapy (Signed)
[facility] [place], [place], [address] Phone: [phone]   Fax:  [phone]  Physical Therapy Treatment  Patient Details  Name: Jocelyn Morris MRN: [id number] Date of Birth: March 05, 1961 Referring Provider (PT): Rod Can MD.   Encounter Date: 04/24/2020  PT End of Session - 04/24/20 1630    Visit Number  11    Number of Visits  12    Date for PT Re-Evaluation  04/17/20    Authorization Type  FOTO.  8th visit 41%    PT Start Time  1515    PT Stop Time  1605    PT Time Calculation (min)  50 min       Past Medical History:  Diagnosis Date  . Arthritis    LEFT KNEE PAIN AND OA; S/P LUMBAR FUSION -- SOME BACK STIFFNESS-ESP AFTER LYING DOWN  . History of kidney stones   . Hypertension   . Hypothyroidism    hypothyroid  . Lymphedema     Past Surgical History:  Procedure Laterality Date  . BACK SURGERY  1999   LUMBAR FUSION- 4 CAGES  . DILATION AND CURETTAGE OF UTERUS  2011  . LAP BAND SURGERY  2009  . TONSILLECTOMY  1984  . TOTAL KNEE ARTHROPLASTY Left 05/09/2013   Procedure: LEFT TOTAL KNEE ARTHROPLASTY;  Surgeon: Gearlean Alf, MD;  Location: [facility];  Service: Orthopedics;  Laterality: Left;  . URETEROSCOPY FOR STONE REMOVAL      There were no vitals filed for this visit.  Subjective Assessment - 04/24/20 1602    Subjective  COVID-19 screen performed prior to patient entering clinic. Did good after last Rx to MD Friday    Pertinent History  Right TKA, arthrits, back surgery, HTN, lymphedema, hypothyroidism.    How long can you walk comfortably?  Short distances with a FWW.    Diagnostic tests  MRI.    Patient Stated Goals  Get out of pain.    Pain Score  4     Pain Location  Leg    Pain Orientation  Right    Pain Descriptors / Indicators  Sore    Pain Onset  More than a month ago                                    PT Long Term Goals - 04/05/20 1728      PT LONG  TERM GOAL #1   Title  Independent with a HEP.    Period  Weeks    Status  On-going      PT LONG TERM GOAL #2   Title  Walk without walker.    Time  4    Period  Weeks    Status  Partially Met      PT LONG TERM GOAL #3   Title  Perform ADL's with pain not > 2-3/10.    Time  4    Period  Weeks    Status  On-going      PT LONG TERM GOAL #4   Title  Perform a right SLR.    Time  4    Period  Weeks    Status  On-going            Plan - 04/24/20 1621    Clinical Impression Statement  Pt arrived today doing better overall and was able to perform 13 mins on  nustep. [place] and STW performed to RT HS with notable soreness distal aspect. Normal modality response.    Comorbidities  Right TKA, arthrits, back surgery, HTN, lymphedema, hypothyroidism.    Examination-Activity Limitations  Locomotion Level;Other    Stability/Clinical Decision Making  Stable/Uncomplicated    Rehab Potential  Good    PT Frequency  3x / week    PT Duration  4 weeks    PT Treatment/Interventions  ADLs/Self Care Home Management;Cryotherapy;Electrical Stimulation;Ultrasound;Moist Heat;Therapeutic activities;Therapeutic exercise;Manual techniques;Patient/family education    PT Next Visit Plan  FOTO and MD note with possible Dc    Consulted and Agree with Plan of Care  Patient       Patient will benefit from skilled therapeutic intervention in order to improve the following deficits and impairments:  Abnormal gait, Decreased activity tolerance, Pain, Decreased strength  Visit Diagnosis: Muscle weakness (generalized)  Pain in left lower leg     Problem List Patient Active Problem List   Diagnosis Date Noted  . Postoperative anemia due to acute blood loss 05/10/2013  . OA (osteoarthritis) of knee 05/09/2013    Georgi Navarrete,[name], PTA 04/24/2020, 4:33 PM  [facility] [place], [place], [address] Phone: [phone]   Fax:  [phone]  Name:  Jocelyn Morris MRN: [id number] Date of Birth: 18-Aug-1961

## 2020-04-26 ENCOUNTER — Encounter: Payer: BC Managed Care – PPO | Admitting: *Deleted

## 2020-09-29 DIAGNOSIS — J189 Pneumonia, unspecified organism: Secondary | ICD-10-CM

## 2020-09-29 HISTORY — DX: Pneumonia, unspecified organism: J18.9

## 2020-11-28 DIAGNOSIS — N179 Acute kidney failure, unspecified: Secondary | ICD-10-CM | POA: Insufficient documentation

## 2020-11-28 DIAGNOSIS — I5031 Acute diastolic (congestive) heart failure: Secondary | ICD-10-CM | POA: Insufficient documentation

## 2020-11-28 DIAGNOSIS — G4730 Sleep apnea, unspecified: Secondary | ICD-10-CM | POA: Insufficient documentation

## 2020-11-28 DIAGNOSIS — I82409 Acute embolism and thrombosis of unspecified deep veins of unspecified lower extremity: Secondary | ICD-10-CM | POA: Insufficient documentation

## 2020-11-28 NOTE — Progress Notes (Signed)
Cardiology Office Note   Date:  11/30/2020   ID:  Jocelyn Morris, DOB Feb 02, 1961, MRN [id number]  PCP:  Jocelyn Alcide, MD  Cardiologist:   [name], MD Referring:  Jocelyn Alcide, MD  Chief Complaint  Patient presents with  . Pulmonary HTN      History of Present Illness: Jocelyn Morris is a 59 y.o. female who presents for evaluation of pulmonary HTN, diastolic HF and a recent hospitalization at [facility].  I reviewed these records for this visit.  She was admitted for AKI with and respiratory failure with acute on chronic diastolic dysfunction.  She had a CT without PE.  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  She desaturated overnight and was thought to have sleep apnea.  She was also found to have a DVT.  She was discharged to rehab on O2.   Of note she was told to stop Ambien and nabumetone at discharge.  Eliquis and Lasix BID were added at discharge.      Since going home she has been on two liters [place].  She has home health nursing and she is doing physical therapy.  She gets around fairly well and uses a walker for distances.  She thinks she lost about 100 pounds total since she was sick.  She thinks she is breathing better.  She is not having any chest pressure, neck or arm discomfort.  She is limited by joint pain.  She is not having any PND or orthopnea.  She is not describing palpitations, presyncope or syncope.  Her lower extremity chronic lymphedema is improved since she has been taking Lasix.  I did see some labs in November and her renal function was okay.  Her potassium was normal.  Past Medical History:  Diagnosis Date  . Arthritis    LEFT KNEE PAIN AND OA; S/P LUMBAR FUSION -- SOME BACK STIFFNESS-ESP AFTER LYING DOWN  . Cardiomegaly   . Chronic diastolic heart failure (HCC)   . History of kidney stones   . Hyperlipidemia   .  Hypertension   . Hypothyroidism    hypothyroid  . Lymphedema   . OSA (obstructive sleep apnea)   . Pulmonary hypertension (HCC)     Past Surgical History:  Procedure Laterality Date  . BACK SURGERY  1999   LUMBAR FUSION- 4 CAGES  . DILATION AND CURETTAGE OF UTERUS  2011  . LAP BAND SURGERY  2009  . TONSILLECTOMY  1984  . TOTAL KNEE ARTHROPLASTY Left 05/09/2013   Procedure: LEFT TOTAL KNEE ARTHROPLASTY;  Surgeon: Loanne Drilling, MD;  Location: [facility];  Service: Orthopedics;  Laterality: Left;  . URETEROSCOPY FOR STONE REMOVAL       Current Outpatient Medications  Medication Sig Dispense Refill  . acetaminophen (TYLENOL) 500 MG tablet Take 1-2 tablets by mouth daily as needed.    . calcium-vitamin D (OSCAL WITH D) 250-125 MG-UNIT tablet Take 1 tablet by mouth daily.    Marland Kitchen ELIQUIS 5 MG TABS tablet Take 5 mg by mouth 2 (two) times daily.    . furosemide (LASIX) 40 MG tablet Take 40 mg by mouth daily.    Marland Kitchen levothyroxine (SYNTHROID) 200 MCG tablet Take 200 mcg by mouth daily.    Marland Kitchen levothyroxine (SYNTHROID) 50 MCG tablet Take 50 mcg by mouth daily.    Marland Kitchen losartan (COZAAR) 50 MG tablet Take  50 mg by mouth daily.    . Multiple Vitamin (MULTIVITAMIN WITH MINERALS) TABS tablet Take 1 tablet by mouth daily.    . polycarbophil (FIBERCON) 625 MG tablet Take 625 mg by mouth daily.    . traMADol (ULTRAM) 50 MG tablet Take 1-2 tablets (50-100 mg total) by mouth every 6 (six) hours as needed. 60 tablet 0  . zolpidem (AMBIEN) 5 MG tablet Take 5 mg by mouth at bedtime as needed.     No current facility-administered medications for this visit.    Allergies:   Keflex [cephalexin] and Penicillins    Social History:  The patient  reports that she has never smoked. She has never used smokeless tobacco. She reports that she does not drink alcohol and does not use drugs.   Family History:  The patient's family history includes Alcoholism in her father; CAD in her father; Diabetes in her father;  Diabetes Mellitus II in her mother; Heart attack in her paternal grandfather; Rheum arthritis in her maternal grandfather; Stroke in her mother.    ROS:  Please see the history of present illness.   Otherwise, review of systems are positive for none.   All other systems are reviewed and negative.    PHYSICAL EXAM: VS:  BP 130/80   Pulse 76   Ht 5\' 6"  (1.676 m)   Wt (!) 367 lb 9.6 oz (166.7 kg)   LMP 03/25/2013   SpO2 98%   BMI 59.33 kg/m  , BMI Body mass index is 59.33 kg/m. GENERAL:  Well appearing HEENT:  Pupils equal round and reactive, fundi not visualized, oral mucosa unremarkable NECK:  No jugular venous distention, waveform within normal limits, carotid upstroke brisk and symmetric, no bruits, no thyromegaly LYMPHATICS:  No cervical, inguinal adenopathy LUNGS:  Clear to auscultation bilaterally BACK:  No CVA tenderness CHEST:  Unremarkable HEART:  PMI not displaced or sustained,S1 and S2 within normal limits, no S3, no S4, no clicks, no rubs, 3 out of 6 holosystolic murmur heard at the left mid sternal border and radiating slightly, no diastolic murmurs ABD:  Flat, positive bowel sounds normal in frequency in pitch, no bruits, no rebound, no guarding, no midline pulsatile mass, no hepatomegaly, no splenomegaly EXT:  2 plus pulses throughout, no edema, no cyanosis no clubbing SKIN:  No rashes no nodules NEURO:  Cranial nerves II through XII grossly intact, motor grossly intact throughout PSYCH:  Cognitively intact, oriented to person place and time    EKG:  EKG is ordered today. The ekg ordered today demonstrates sinus rhythm, rate 76, axis within normal limits, intervals within normal limits, no acute ST-T wave changes.   Recent Labs: No results found for requested labs within last 8760 hours.    Lipid Panel No results found for: CHOL, TRIG, HDL, CHOLHDL, VLDL, LDLCALC, LDLDIRECT    Wt Readings from Last 3 Encounters:  11/30/20 (!) 367 lb 9.6 oz (166.7 kg)   01/27/20 (!) 430 lb (195 kg)  05/09/13 (!) 316 lb (143.3 kg)      Other studies Reviewed: Additional studies/ records that were reviewed today include:  Extensive review of [facility] hospitalization and test results.  Primary office notes reviewed.    (Greater than 40 minutes reviewing all data with greater than 50% face to face with the patient). Review of the above records demonstrates:  Please see elsewhere in the note.     ASSESSMENT AND PLAN:  PROBABLE SLEEP APNEA:    She is going to have  a sleep study soon.  AKI: Her renal function improved.  MORBID OBEISTY: We had a long conversation about this.  We talked about diet.  We talked about activity.  We talked about the gradual nature of weight loss.  We did begin to talk about maybe eventual bariatric surgery.  ACUTE DIASTOLIC HF: She seems to be actually euvolemic.  No change in therapy.  DVT: I would suggest she continue the blood thinner until I see her in February.  HYPOVENTILATION OBESITY SYNDROME: We had a long discussion about this and the physiology.  I reviewed in detail.  I will be repeating an echocardiogram to see if her RV dysfunction and pulmonary hypertension is improved.  I will do that at the 6 months which will be around May.  Until then she should continue the meds as listed.  Of note she does have a new patient appointment with Dr. Craige Cotta upcoming.  I would do a right heart catheterization if her pulmonary pressures do not improve at the time of the next echo.  HYPERTHYROID: Patient has had significant problems with her thyroid and would like referral to endocrinology and I will be happy to facilitate this.  Current medicines are reviewed at length with the patient today.  The patient does not have concerns regarding medicines.  The following changes have been made:  no change  Labs/ tests ordered today include:   Orders Placed This Encounter  Procedures  . Ambulatory referral to Endocrinology  . EKG  12-Lead  . ECHOCARDIOGRAM COMPLETE     Disposition:   FU with me in Feb in [place].     Signed, [name], MD  11/30/2020 11:01 AM    [facility]

## 2020-11-29 ENCOUNTER — Encounter: Payer: Self-pay | Admitting: *Deleted

## 2020-11-30 ENCOUNTER — Encounter: Payer: Self-pay | Admitting: Cardiology

## 2020-11-30 ENCOUNTER — Ambulatory Visit (INDEPENDENT_AMBULATORY_CARE_PROVIDER_SITE_OTHER): Payer: BC Managed Care – PPO | Admitting: Cardiology

## 2020-11-30 VITALS — BP 130/80 | HR 76 | Ht 66.0 in | Wt 367.6 lb

## 2020-11-30 DIAGNOSIS — E039 Hypothyroidism, unspecified: Secondary | ICD-10-CM

## 2020-11-30 DIAGNOSIS — I5031 Acute diastolic (congestive) heart failure: Secondary | ICD-10-CM

## 2020-11-30 DIAGNOSIS — N179 Acute kidney failure, unspecified: Secondary | ICD-10-CM | POA: Diagnosis not present

## 2020-11-30 DIAGNOSIS — G4730 Sleep apnea, unspecified: Secondary | ICD-10-CM

## 2020-11-30 DIAGNOSIS — I82409 Acute embolism and thrombosis of unspecified deep veins of unspecified lower extremity: Secondary | ICD-10-CM

## 2020-11-30 NOTE — Patient Instructions (Addendum)
Medication Instructions:   Your physician recommends that you continue on your current medications as directed. Please refer to the Current Medication list given to you today.  Labwork:  None  Testing/Procedures: Your physician has requested that you have an echocardiogram in May 2022. Echocardiography is a painless test that uses sound waves to create images of your heart. It provides your doctor with information about the size and shape of your heart and how well your heart's chambers and valves are working. This procedure takes approximately one hour. There are no restrictions for this procedure.  Follow-Up:  Your physician recommends that you schedule a follow-up appointment in: February 2022 with Dr. Antoine Poche in [place].  Any Other Special Instructions Will Be Listed Below (If Applicable).  If you need a refill on your cardiac medications before your next appointment, please call your pharmacy.

## 2020-12-26 IMAGING — DX DG LUMBAR SPINE COMPLETE 4+V
5 series · 5 of 5 positions shown · non-contrast
Comparison: None; correlation MR lumbar spine 11/06/1998

CLINICAL DATA: Slipped on ice going down steps injuring back of leg
from knee up, prior low back surgery

EXAM:
LUMBAR SPINE - COMPLETE 4+ VIEW

[l-spine ap]
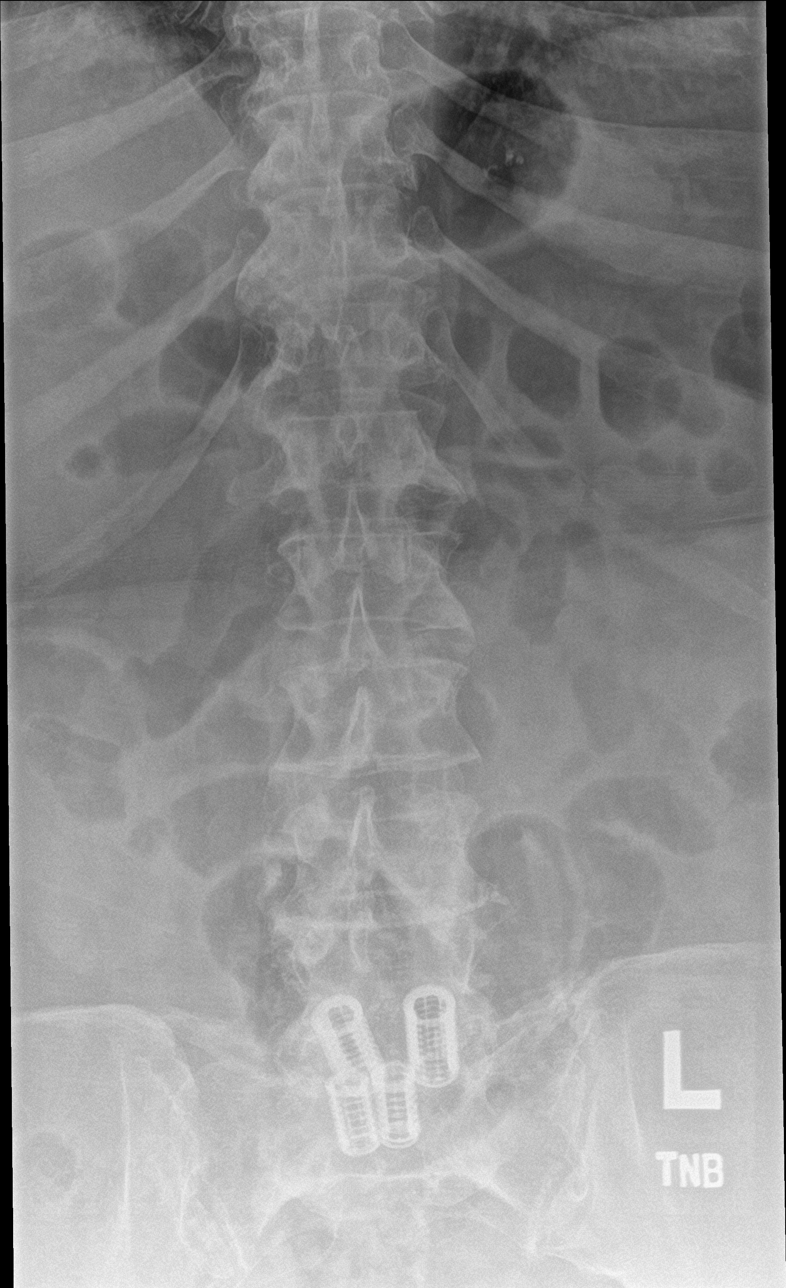

[l-spine obl (1 of 2)]
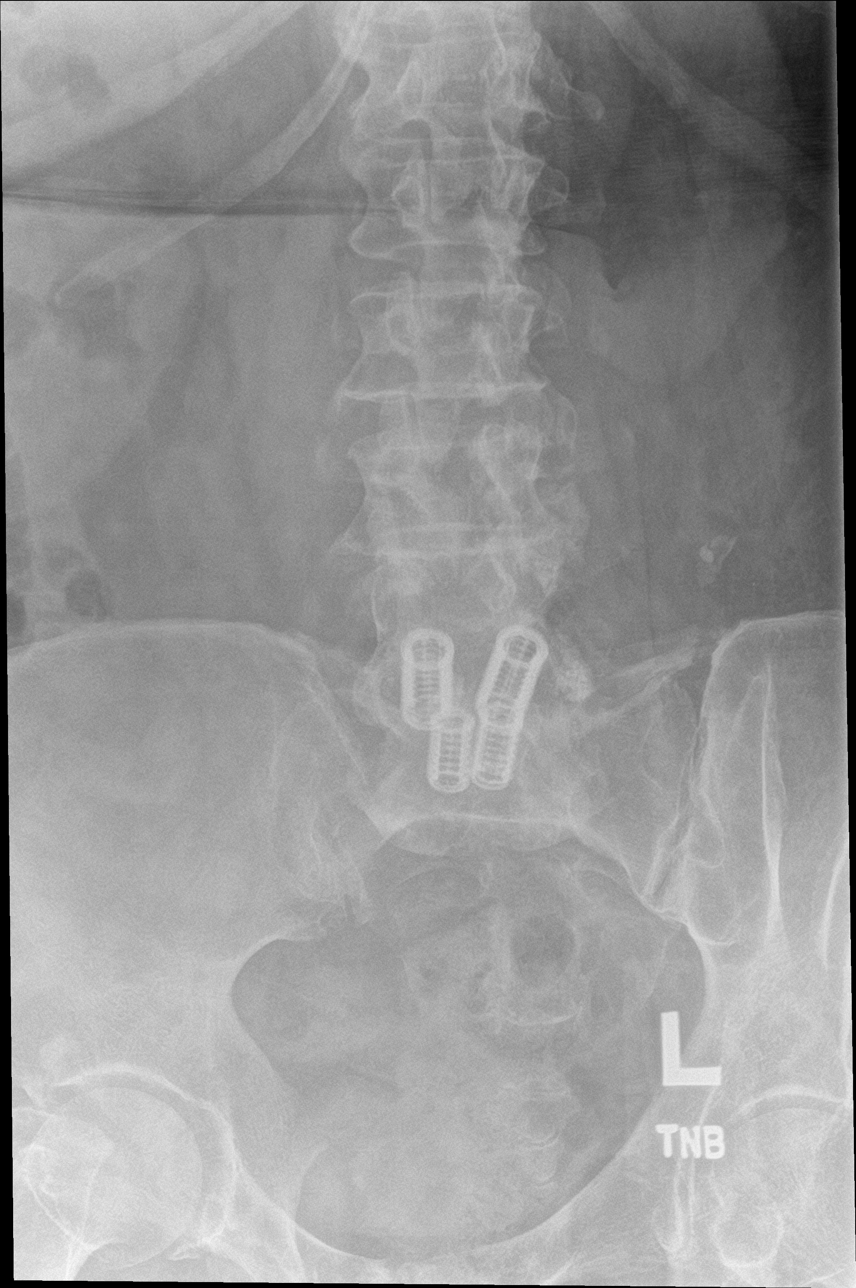

[l-spine obl (2 of 2)]
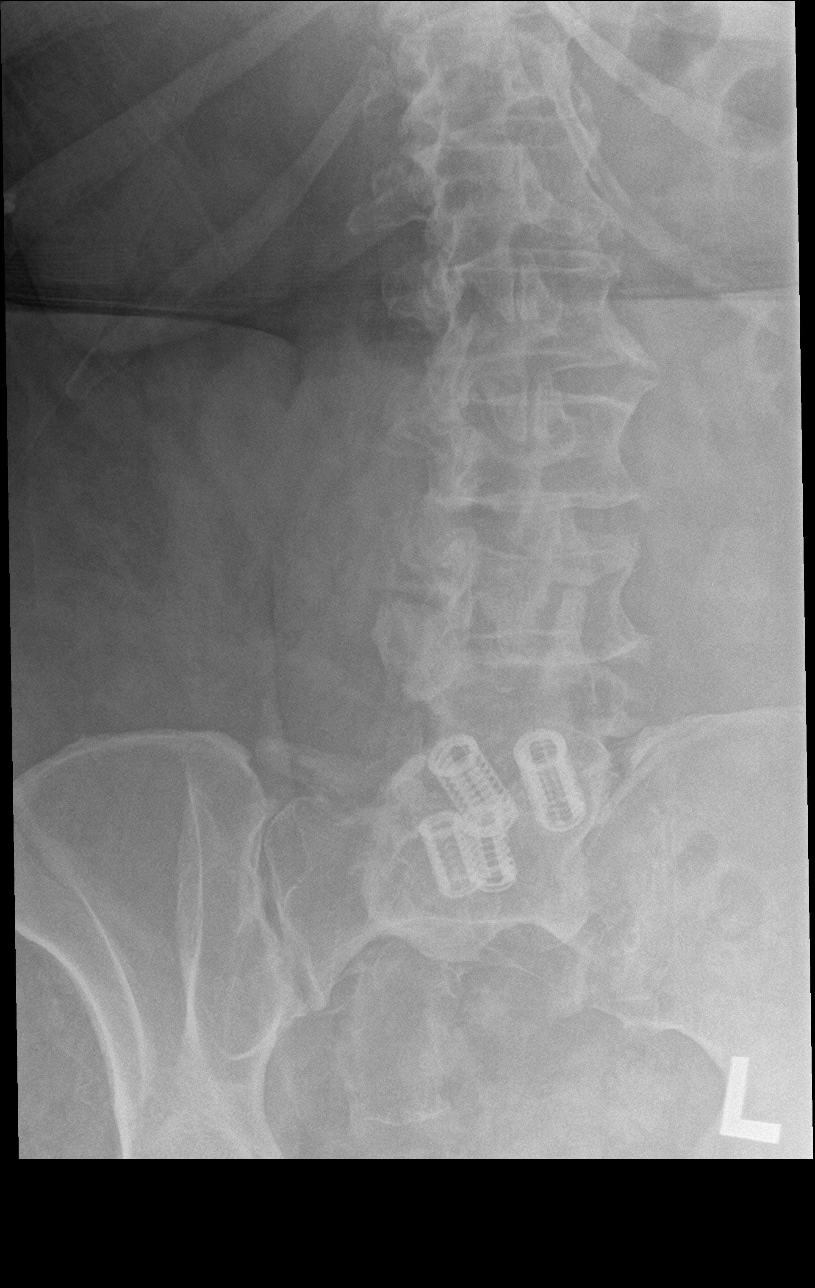

[l-spine lat]
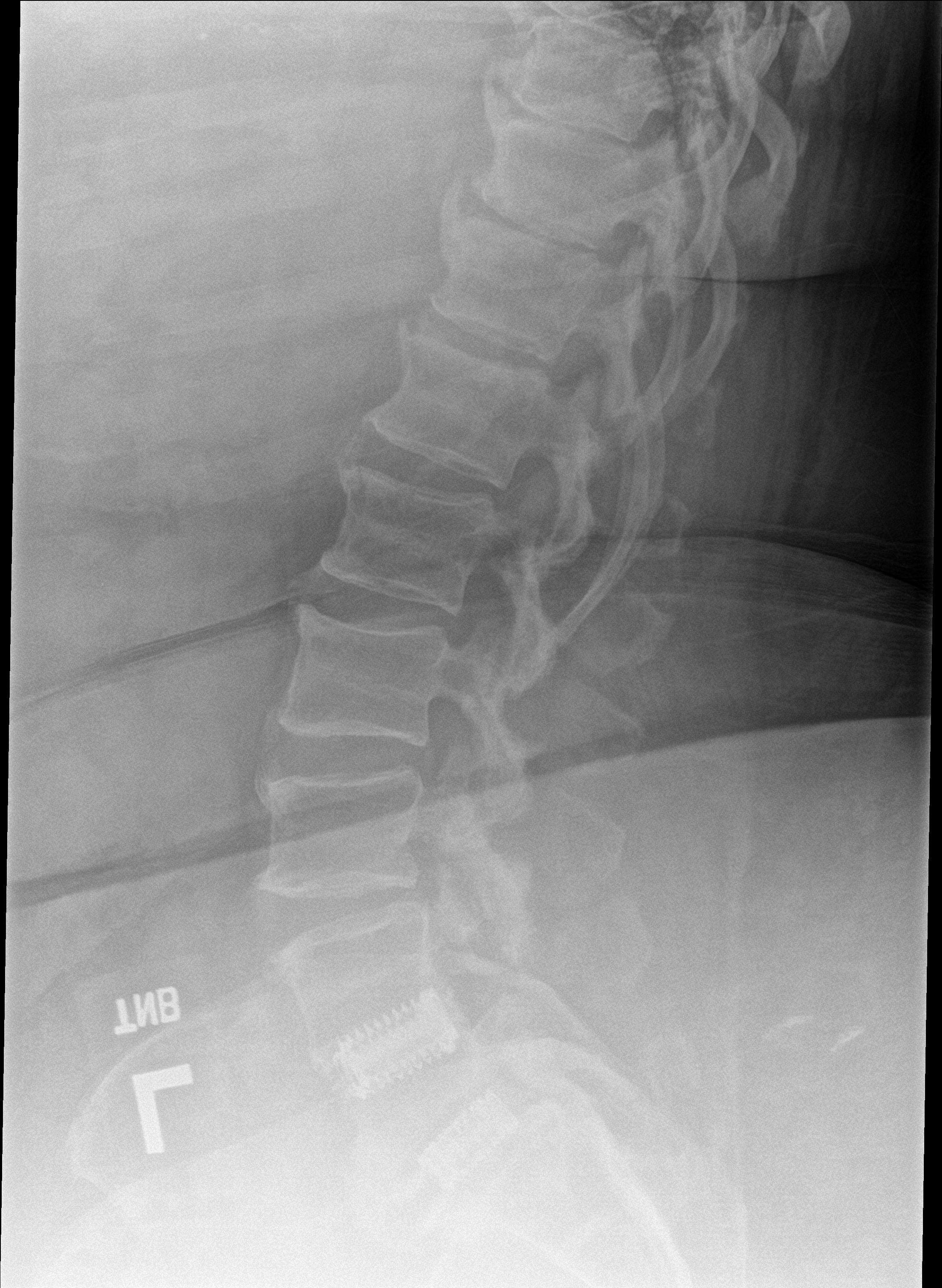

[l-spine spot]
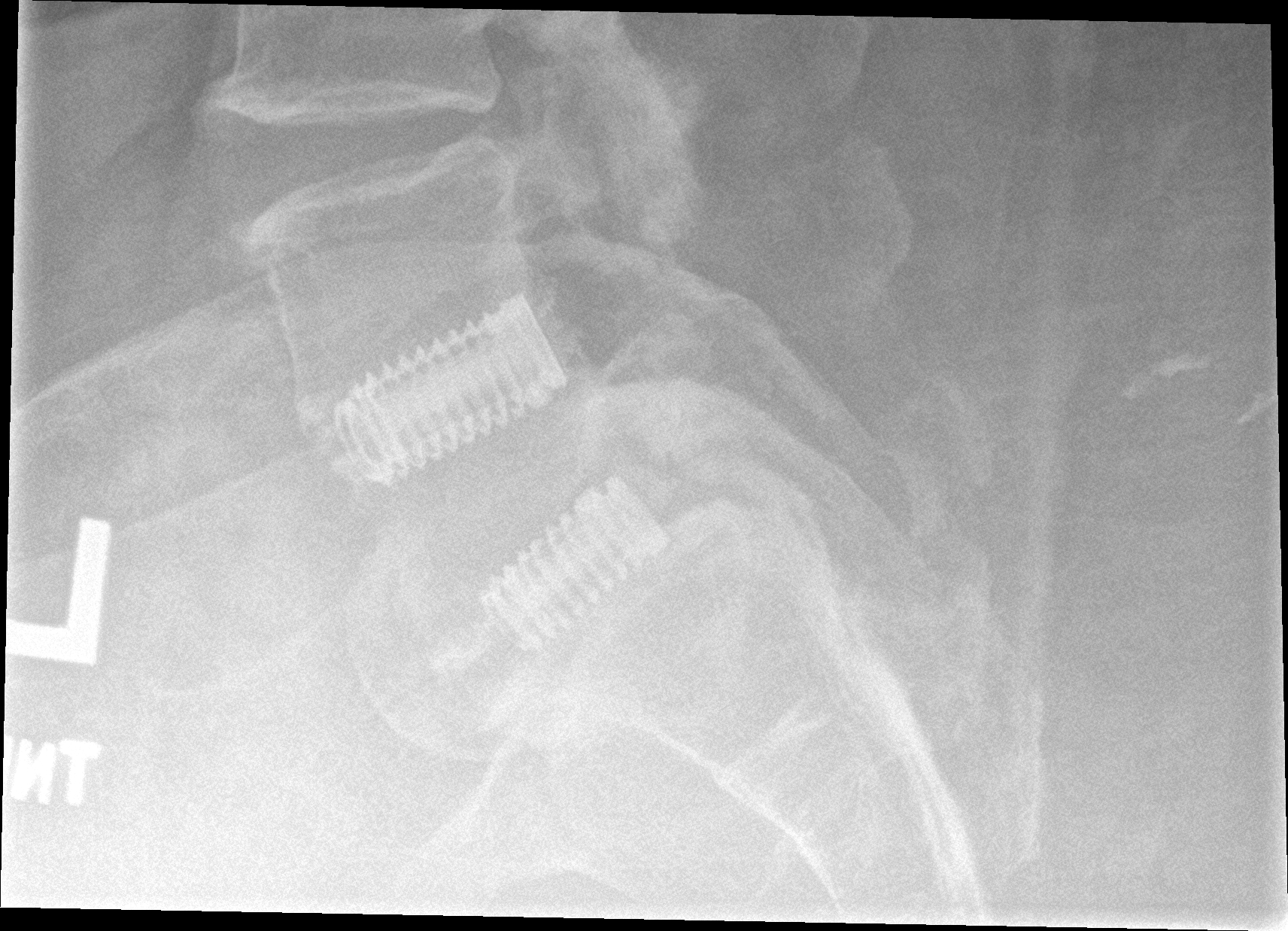

[5 of 5 positions shown; findings below may reference images not displayed]

FINDINGS: Prior MRI labeled with BILATERAL Ray cage fusion of L4-L5 and L5-S1,
current exam labeled accordingly.

Mild osseous demineralization.

BILATERAL ray cages at L4-L5 and L5-S1.

Scattered mild endplate spur formation in the lower thoracic and
lumbar spine.

Vertebral body heights maintained without fracture or subluxation.

No bone destruction or definite spondylolysis.

SI joints appear preserved.
IMPRESSION: Prior lower lumbar surgery.

Scattered degenerative disc disease changes.

No acute osseous abnormalities.

## 2020-12-26 IMAGING — DX DG HIP (WITH OR WITHOUT PELVIS) 2-3V*R*
3 series · 3 of 3 positions shown · non-contrast
Comparison: None

CLINICAL DATA: Slipped on ice going down stairs, injury to back of
RIGHT leg

EXAM:
DG HIP (WITH OR WITHOUT PELVIS) 2-3V RIGHT

[pelvis ap]
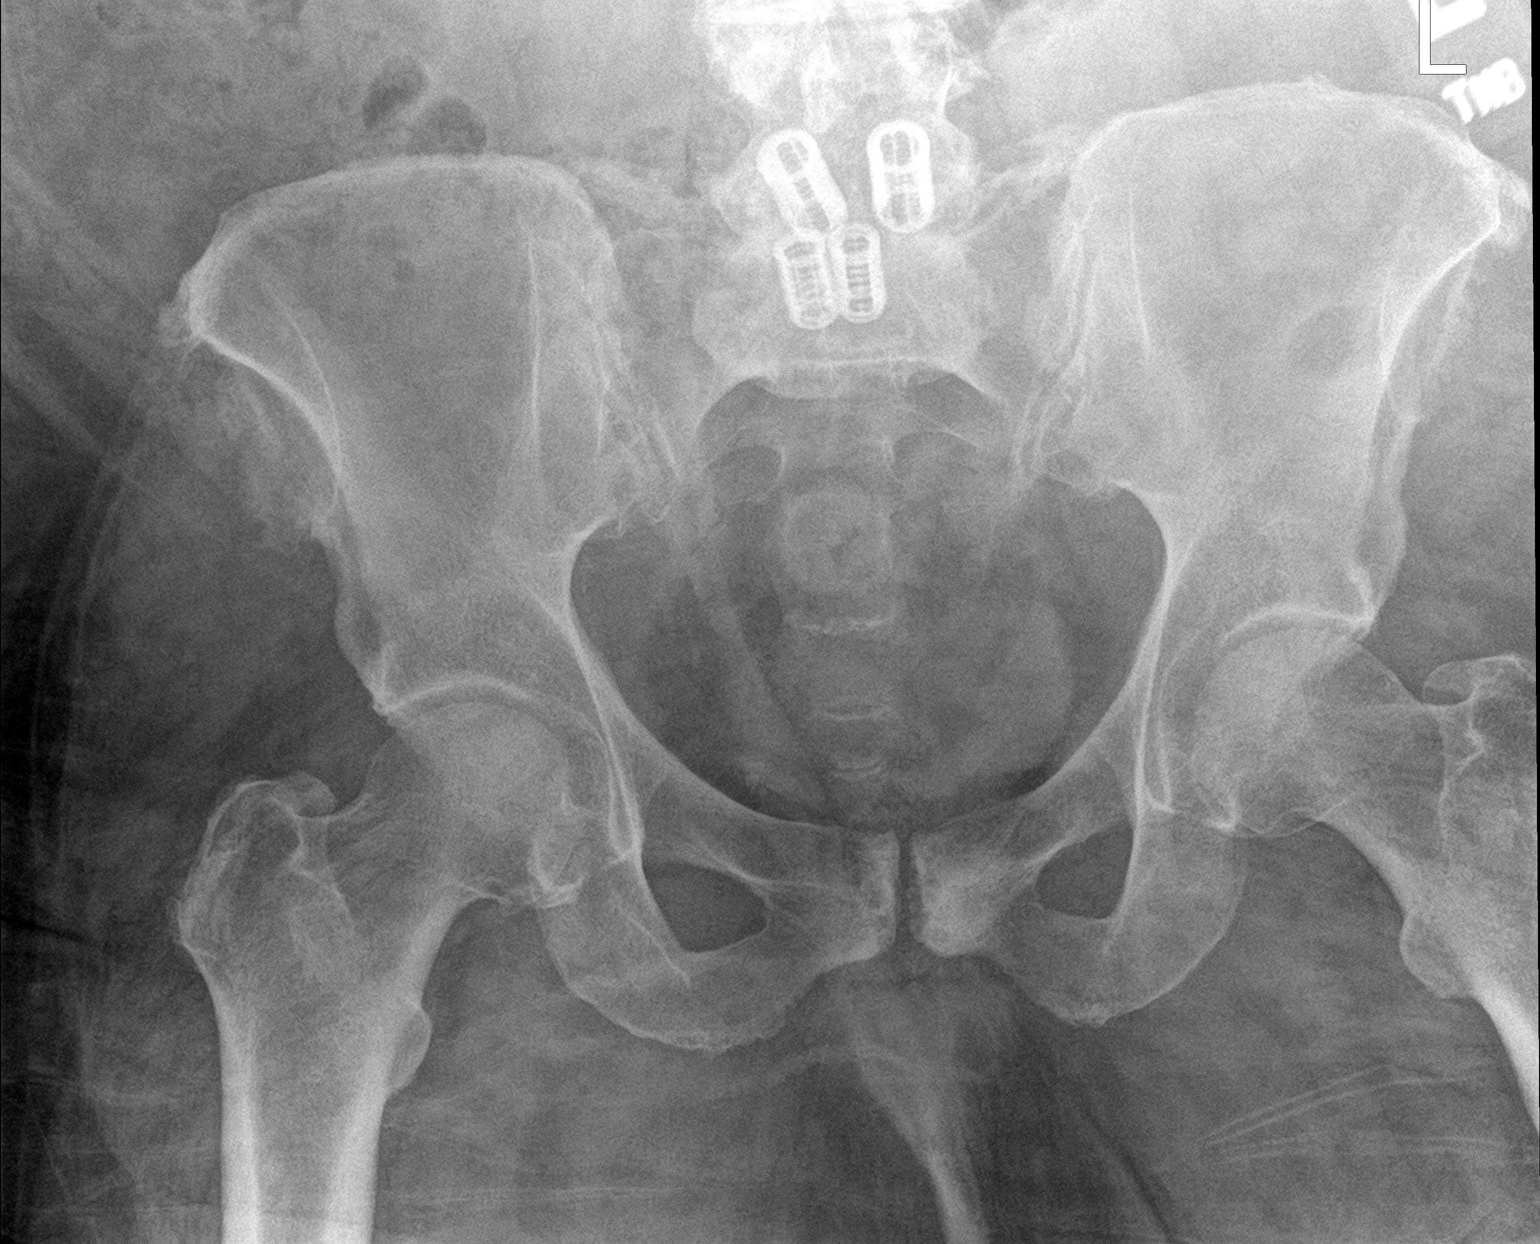

[hip ap]
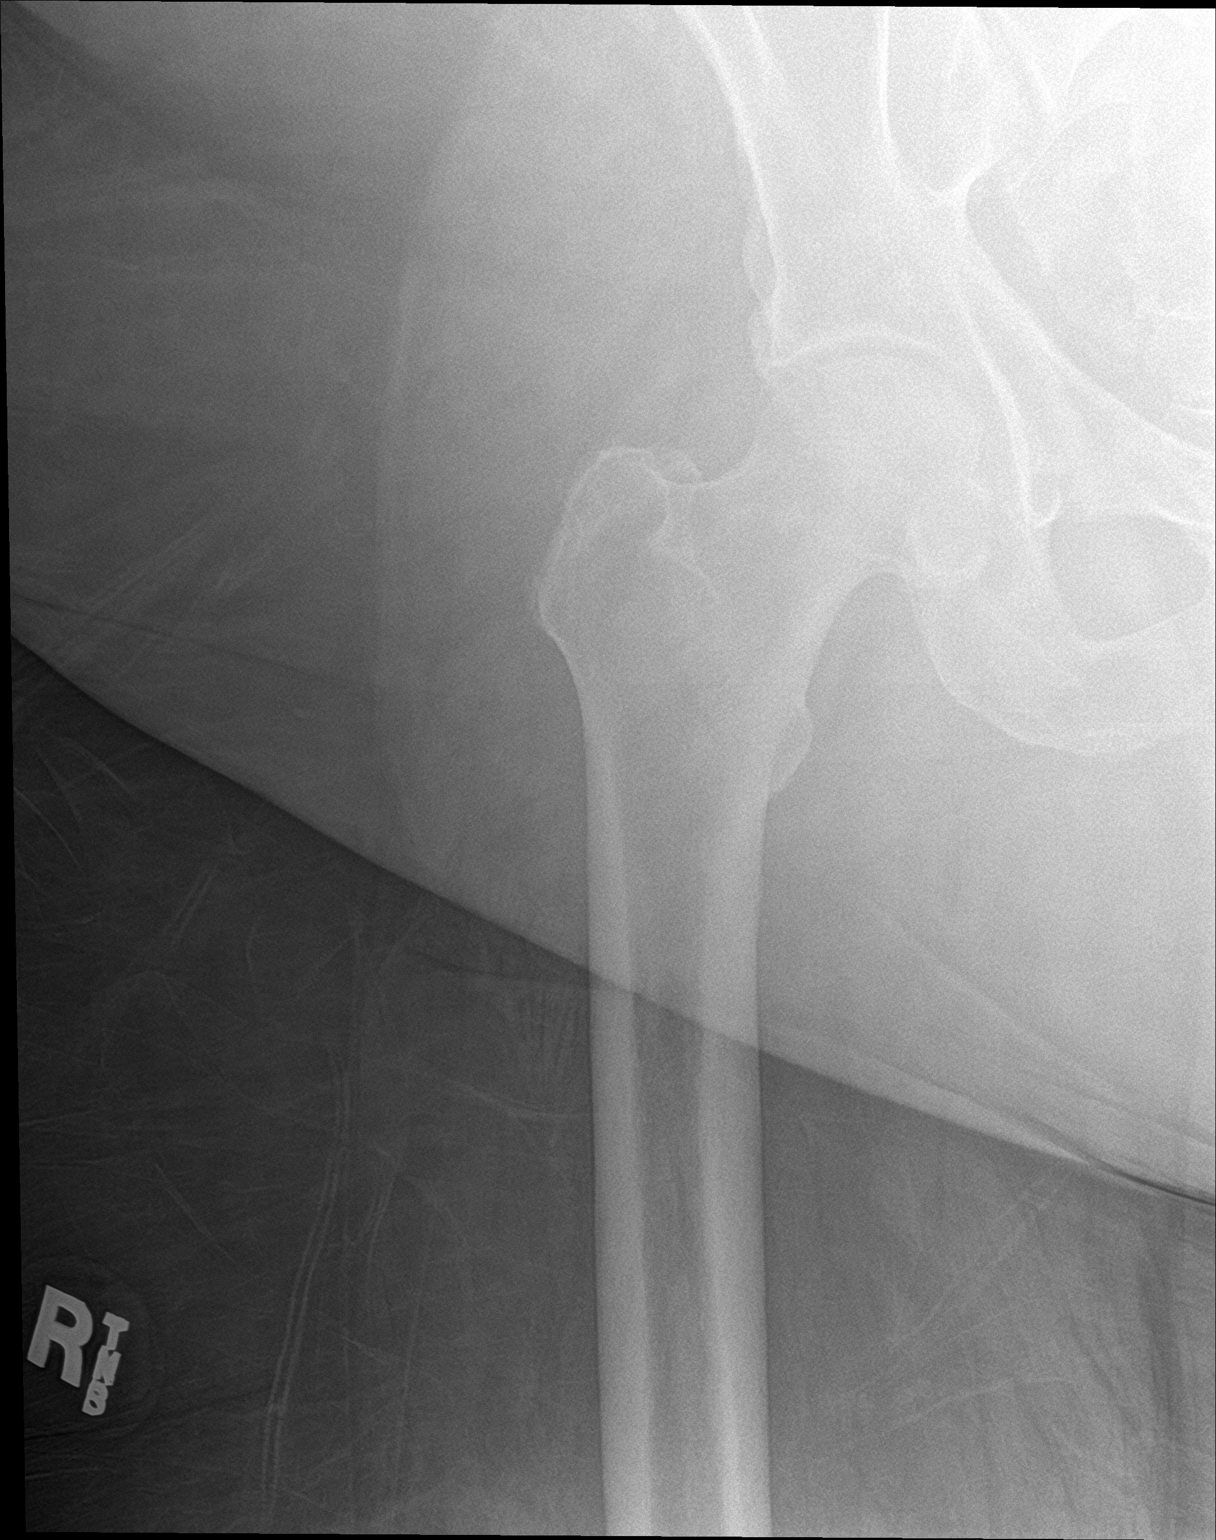

[hip lat]
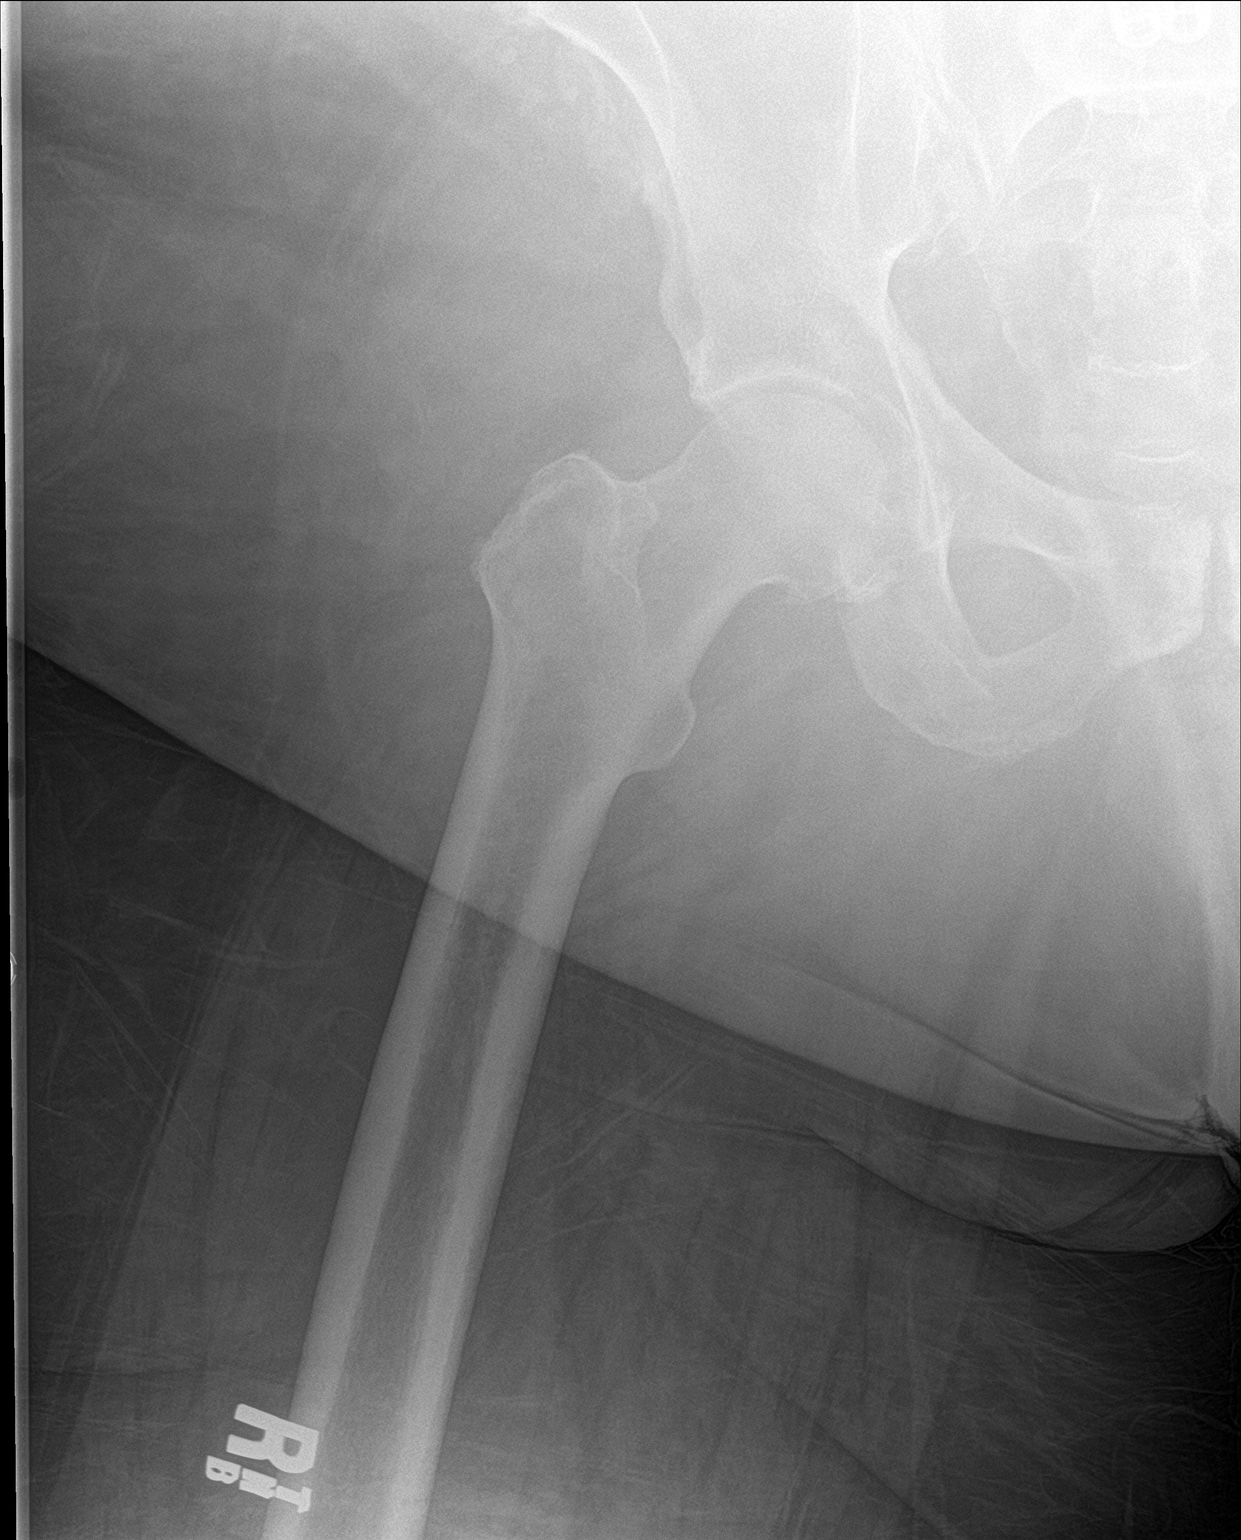

[3 of 3 positions shown; findings below may reference images not displayed]

FINDINGS: Paired ray cages at adjacent levels of the lower lumbar spine.

Osteitis pubis.

Hip and SI joint spaces preserved.

No acute fracture, dislocation, or bone destruction.
IMPRESSION: No acute osseous abnormalities.

## 2021-01-01 ENCOUNTER — Telehealth: Payer: Self-pay | Admitting: Pulmonary Disease

## 2021-01-01 ENCOUNTER — Ambulatory Visit: Payer: BC Managed Care – PPO | Admitting: Pulmonary Disease

## 2021-01-01 ENCOUNTER — Encounter: Payer: Self-pay | Admitting: Pulmonary Disease

## 2021-01-01 ENCOUNTER — Other Ambulatory Visit: Payer: Self-pay

## 2021-01-01 ENCOUNTER — Ambulatory Visit (HOSPITAL_COMMUNITY)
Admission: RE | Admit: 2021-01-01 | Discharge: 2021-01-01 | Disposition: A | Discharge status: Home / Self Care | Payer: BC Managed Care – PPO | Source: Ambulatory Visit | Attending: Pulmonary Disease | Admitting: Pulmonary Disease

## 2021-01-01 VITALS — BP 142/84 | HR 78 | Temp 97.3°F | Ht 66.0 in | Wt 368.6 lb

## 2021-01-01 DIAGNOSIS — J9611 Chronic respiratory failure with hypoxia: Secondary | ICD-10-CM | POA: Diagnosis not present

## 2021-01-01 DIAGNOSIS — J90 Pleural effusion, not elsewhere classified: Secondary | ICD-10-CM

## 2021-01-01 DIAGNOSIS — J9612 Chronic respiratory failure with hypercapnia: Secondary | ICD-10-CM

## 2021-01-01 DIAGNOSIS — E662 Morbid (severe) obesity with alveolar hypoventilation: Secondary | ICD-10-CM

## 2021-01-01 DIAGNOSIS — G4733 Obstructive sleep apnea (adult) (pediatric): Secondary | ICD-10-CM | POA: Diagnosis not present

## 2021-01-01 NOTE — Telephone Encounter (Signed)
DG Chest 2 View  Result Date: 01/01/2021 CLINICAL DATA:  Right pleural effusion. EXAM: CHEST - 2 VIEW COMPARISON:  October 07, 2020. FINDINGS: The heart size and mediastinal contours are within normal limits. Both lungs are clear. No pneumothorax or pleural effusion is noted. The visualized skeletal structures are unremarkable. IMPRESSION: No active cardiopulmonary disease. Electronically Signed   By: Lupita Raider M.D.   On: 01/01/2021 13:40     Please let her know her chest xray looks better.  No sign of fluid around right lung anymore.

## 2021-01-01 NOTE — Telephone Encounter (Signed)
HST 12/06/20 >> AHI 55.3, SpO2 low 70%

## 2021-01-01 NOTE — Progress Notes (Signed)
[facility]  Chief Complaint  Patient presents with  . Consult    Sleep consult, No respiratory complaints currently, On 2 Liters O2 currently    Constitutional:  BP (!) 142/84 (BP Location: Left Arm, Cuff Size: Normal)   Pulse 78   Temp (!) 97.3 F (36.3 C) (Other (Comment)) Comment (Src): wrist  Ht 5\' 6"  (1.676 m)   Wt (!) 368 lb 9.6 oz (167.2 kg)   LMP 03/25/2013   SpO2 100% Comment: 2L Pulse O2  BMI 59.49 kg/m   Past Medical History:  Diastolic CHF, Nephrolithiasis, HLD, HTN, Hypothyroidism, Lymphedema  Past Surgical History:  She  has a past surgical history that includes Back surgery (1999); LAP BAND SURGERY (2009); Dilation and curettage of uterus (2011); Tonsillectomy (1984); URETEROSCOPY FOR STONE REMOVAL; and Total knee arthroplasty (Left, 05/09/2013).  Brief Summary:  Jocelyn Morris is a 60 y.o. female with obstructive sleep apnea, obesity hypoventilation syndrome, and chronic hypoxic/hypercapnic respiratory failure.      Subjective:   She was in hospital in October 2021.  Found to have hypoxia, hypercapnia, and pulmonary hypertension.  Started on 2 liters oxygen.  She reports having home sleep study (not available for review at this time).  Was told she had severe sleep apnea.  She has order for auto CPAP, but hasn't received yet.    She feels her breathing gets shallow.  She snores some.  She has trouble staying asleep.  She is being evaluated for arthritis.  She has family history of rheumatoid arthritis.  Physical Exam:   Appearance - well kempt   ENMT - no sinus tenderness, no oral exudate, no LAN, Mallampati 4 airway, no stridor  Respiratory - equal breath sounds bilaterally, no wheezing or rales  CV - s1s2 regular rate and rhythm, no murmurs  Ext - 2+ non pitting edema  Skin - no rashes  Psych - normal mood and affect   Pulmonary testing:   ABG 10/10/20 >> pH 7.39, PCO2 82, PO2 55  Chest Imaging:    CT angio chest 10/08/20 >> large Rt pleural effusion  Sleep Tests:    Cardiac Tests:   Echo 10/08/20 >> EF 65 to 70%, grade 1 DD, mod/severe RV dilation and systolic dysfx, PASP 80 mmHg  Doppler legs 10/15/20 >> DVT Rt saphenofemoral junction  Social History:  She  reports that she has never smoked. She has never used smokeless tobacco. She reports that she does not drink alcohol and does not use drugs.  Family History:  Her family history includes Alcoholism in her father; CAD in her father; Diabetes in her father; Diabetes Mellitus II in her mother; Heart attack in her paternal grandfather; Rheum arthritis in her maternal grandfather; Stroke in her mother.     Assessment/Plan:   Obstructive sleep apnea. 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 - relate to obstructive sleep apnea and obesity hypoventilation syndrome - she uses 2 liters oxygen 24/7 - will arrange for humidifier for home oxygen system; she uses [employer] for home oxygen set up - she will look into getting an inogen POC - explained how her weight is  main factor with her sleep disordered breathing and respiratory failure  Right pleural effusion. - seen on CT chest from October 2021 - will repeat chest xray today  Lower extremity DVT. - eliquis through her PCP  Arthritis. - she is being assessed for rheumatoid arthritis  Time Spent Involved in Patient Care on Day of Examination:  47 minutes  Follow up:  Patient Instructions  Chest xray today  Will arrange for CPAP titration study in [facility] sleep lab  Will have [employer] arrange for humidifier set up for home  oxygen system  Go ahead and get started with auto CPAP machine through [facility]  Let us know once you have paperwork to get [employer] portable oxygen concentrator  Will get copy of your home sleep study from your PCP's office  Will call to schedule follow up after CPAP titration study completed   Medication List:   Allergies as of 01/01/2021      Reactions   Keflex [cephalexin]    Upset stomach   Penicillins Swelling, Rash   Swelling to throat .Did it involve swelling of the face/tongue/throat, SOB, or low BP? Yes Did it involve sudden or severe rash/hives, skin peeling, or any reaction on the inside of your mouth or nose? Yes Did you need to seek medical attention at a hospital or doctor's office? Yes When did it last happen?1981 If all above answers are "NO", may proceed with cephalosporin use.      Medication List       Accurate as of January 01, 2021 10:26 AM. If you have any questions, ask your nurse or doctor.        STOP taking these medications   zolpidem 5 MG tablet Commonly known as: AMBIEN Stopped by: Coralyn Helling, MD     TAKE these medications   acetaminophen 500 MG tablet Commonly known as: TYLENOL Take 1-2 tablets by mouth daily as needed.   calcium-vitamin D 250-125 MG-UNIT tablet Commonly known as: OSCAL WITH D Take 1 tablet by mouth daily.   Eliquis 5 MG Tabs tablet Generic drug: apixaban Take 5 mg by mouth 2 (two) times daily.   furosemide 40 MG tablet Commonly known as: LASIX Take 40 mg by mouth 2 (two) times daily.   levothyroxine 200 MCG tablet Commonly known as: SYNTHROID Take 200 mcg by mouth daily.   levothyroxine 50 MCG tablet Commonly known as: SYNTHROID Take 50 mcg by mouth daily.   losartan 50 MG tablet Commonly known as: COZAAR Take 50 mg by mouth daily.   multivitamin with minerals Tabs tablet Take 1 tablet by mouth daily.   polycarbophil 625 MG tablet Commonly known as: FIBERCON Take 625 mg by mouth  daily.   traMADol 50 MG tablet Commonly known as: ULTRAM Take 1-2 tablets (50-100 mg total) by mouth every 6 (six) hours as needed.       Signature:  Coralyn Helling, MD [facility] Pulmonary/Critical Care Pager - [phone] 01/01/2021, 10:26 AM

## 2021-01-01 NOTE — Patient Instructions (Signed)
Chest xray today  Will arrange for CPAP titration study in [facility] sleep lab  Will have [employer] arrange for humidifier set up for home oxygen system  Go ahead and get started with auto CPAP machine through [facility]  Let us know once you have paperwork to get [employer] portable oxygen concentrator  Will get copy of your home sleep study from your PCP's office  Will call to schedule follow up after CPAP titration study completed

## 2021-01-01 NOTE — Telephone Encounter (Signed)
Called and went over xray results per Dr Sood with patient. All questions answered and patient expressed full understanding. Nothing further needed at this time.

## 2021-01-03 ENCOUNTER — Ambulatory Visit: Payer: BC Managed Care – PPO | Admitting: Internal Medicine

## 2021-02-12 NOTE — Progress Notes (Unsigned)
Cardiology Office Note   Date:  02/13/2021   ID:  [name], DOB 06/23/1961, MRN [id number]  PCP:  Juliette Alcide, MD  Cardiologist:   [name], MD Referring:  Juliette Alcide, MD  Chief Complaint  Patient presents with  . Shortness of Breath      History of Present Illness: Jocelyn Morris is a 60 y.o. female who presents for evaluation of pulmonary HTN, diastolic HF and a hospitalization at [facility].   She was admitted for AKI with and respiratory failure with acute on chronic diastolic dysfunction.  She had a CT without PE.  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  She desaturated overnight and was thought to have sleep apnea.  She was also found to have a DVT.  She was discharged to rehab on O2.   Of note she was told to stop Ambien and nabumetone at discharge.  Eliquis and Lasix BID were added at discharge.    She was sent home on 2 liters of O2.     She says that she is breathing better.  She still uses her 2 L of oxygen and she will fall into the 80s oxygen sat if she takes it off and ambulates.  She been ambulating a little bit more.  She did not make it to her sleep apnea study because they told her there was nobody who can assist her in a hospital bed.  This was supposed to be rescheduled when they had more help.  She has not heard from them.  I had arranged an endocrinology visit at her request but her TSH is now normal and so her primary canceled this.  She is not having any new shortness of breath but has her baseline.  She is not having any PND or orthopnea.  She is not having any chest pressure, neck or arm discomfort.  He has had no new lower extremity swelling.   Past Medical History:  Diagnosis Date  . Arthritis    LEFT KNEE PAIN AND OA; S/P LUMBAR FUSION -- SOME BACK STIFFNESS-ESP AFTER LYING DOWN  . Cardiomegaly   . Chronic  diastolic heart failure (HCC)   . History of kidney stones   . Hyperlipidemia   . Hypertension   . Hypothyroidism    hypothyroid  . Lymphedema   . OSA (obstructive sleep apnea)   . Pulmonary hypertension (HCC)     Past Surgical History:  Procedure Laterality Date  . BACK SURGERY  1999   LUMBAR FUSION- 4 CAGES  . DILATION AND CURETTAGE OF UTERUS  2011  . LAP BAND SURGERY  2009  . TONSILLECTOMY  1984  . TOTAL KNEE ARTHROPLASTY Left 05/09/2013   Procedure: LEFT TOTAL KNEE ARTHROPLASTY;  Surgeon: Loanne Drilling, MD;  Location: [facility];  Service: Orthopedics;  Laterality: Left;  . URETEROSCOPY FOR STONE REMOVAL       Current Outpatient Medications  Medication Sig Dispense Refill  . acetaminophen (TYLENOL) 500 MG tablet Take 1-2 tablets by mouth daily as needed.    . calcium-vitamin D (OSCAL WITH D) 250-125 MG-UNIT tablet Take 1 tablet by mouth daily.    . furosemide (LASIX) 40 MG tablet Take 40 mg by mouth 2 (two) times daily.    Marland Kitchen levothyroxine (SYNTHROID) 200 MCG tablet Take 200 mcg by mouth daily.    Marland Kitchen levothyroxine (SYNTHROID) 50 MCG  tablet Take 50 mcg by mouth daily.    Marland Kitchen losartan (COZAAR) 50 MG tablet Take 50 mg by mouth daily.    . Multiple Vitamin (MULTIVITAMIN WITH MINERALS) TABS tablet Take 1 tablet by mouth daily.    . rivaroxaban (XARELTO) 20 MG TABS tablet Take 20 mg by mouth daily with supper.    . traMADol (ULTRAM) 50 MG tablet Take 1-2 tablets (50-100 mg total) by mouth every 6 (six) hours as needed. 60 tablet 0  . polycarbophil (FIBERCON) 625 MG tablet Take 625 mg by mouth daily. (Patient not taking: No sig reported)     No current facility-administered medications for this visit.    Allergies:   Keflex [cephalexin] and Penicillins    ROS:  Please see the history of present illness.   Otherwise, review of systems are positive for none.   All other systems are reviewed and negative.    PHYSICAL EXAM: VS:  BP (!) 138/98   Pulse 88   Ht 5\' 6"  (1.676 m)   Wt  (!) 360 lb (163.3 kg)   LMP 03/25/2013   BMI 58.11 kg/m  , BMI Body mass index is 58.11 kg/m. GENERAL:  Well appearing NECK:  No jugular venous distention, waveform within normal limits, carotid upstroke brisk and symmetric, no bruits, no thyromegaly LUNGS:  Clear to auscultation bilaterally CHEST:  Unremarkable HEART:  PMI not displaced or sustained,S1 and S2 within normal limits, no S3, no S4, no clicks, no rubs, 2 out of 6 apical systolic murmur radiating at the aortic outflow tract, holosystolic, no diastolic murmurs ABD:  Flat, positive bowel sounds normal in frequency in pitch, no bruits, no rebound, no guarding, no midline pulsatile mass, no hepatomegaly, no splenomegaly EXT:  2 plus pulses throughout, no edema, no cyanosis no clubbing   EKG:  EKG is not ordered today.    Recent Labs: No results found for requested labs within last 8760 hours.    Lipid Panel No results found for: CHOL, TRIG, HDL, CHOLHDL, VLDL, LDLCALC, LDLDIRECT    Wt Readings from Last 3 Encounters:  02/13/21 (!) 360 lb (163.3 kg)  01/01/21 (!) 368 lb 9.6 oz (167.2 kg)  11/30/20 (!) 367 lb 9.6 oz (166.7 kg)      Other studies Reviewed: Additional studies/ records that were reviewed today include:  Pulmonary notes  Review of the above records demonstrates:  Please see elsewhere in the note.     ASSESSMENT AND PLAN:  PROBABLE SLEEP APNEA:   She cancelled her sleep apnea CPAP titration.  I think it is important that this is rescheduled and I did send a message to Dr. 14/03/21 and I encouraged her to follow-up with suggested studies and management.   MORBID OBEISTY:    She has lost a little weight and I encouraged more the same.  ACUTE DIASTOLIC HF:   She seems to be euvolemic.  We again talked about salt and she has restricted this.  DVT:   I am going to continue therapy for total of 6 months.  She can then stop her Xarelto will be on aspirin only.   HYPOVENTILATION OBESITY SYNDROME:   She has some  RV dysfunction and pulmonary hypertension.  I be doing that around May and follow her up after that.  HYPOTHYROID:   This is followed by her PCP  Current medicines are reviewed at length with the patient today.  The patient does not have concerns regarding medicines.  The following changes have been made:  None  Labs/ tests ordered today include: None  No orders of the defined types were placed in this encounter.    Disposition:   FU with me in May   Signed, [name], MD  02/13/2021 12:52 PM    [facility]

## 2021-02-13 ENCOUNTER — Other Ambulatory Visit: Payer: Self-pay

## 2021-02-13 ENCOUNTER — Ambulatory Visit (INDEPENDENT_AMBULATORY_CARE_PROVIDER_SITE_OTHER): Payer: BC Managed Care – PPO | Admitting: Cardiology

## 2021-02-13 ENCOUNTER — Encounter: Payer: Self-pay | Admitting: Cardiology

## 2021-02-13 VITALS — BP 138/98 | HR 88 | Ht 66.0 in | Wt 360.0 lb

## 2021-02-13 DIAGNOSIS — G4730 Sleep apnea, unspecified: Secondary | ICD-10-CM

## 2021-02-13 DIAGNOSIS — I82409 Acute embolism and thrombosis of unspecified deep veins of unspecified lower extremity: Secondary | ICD-10-CM | POA: Diagnosis not present

## 2021-02-13 DIAGNOSIS — I5031 Acute diastolic (congestive) heart failure: Secondary | ICD-10-CM | POA: Diagnosis not present

## 2021-02-13 NOTE — Patient Instructions (Signed)
Medication Instructions:  The current medical regimen is effective;  continue present plan and medications.  *If you need a refill on your cardiac medications before your next appointment, please call your pharmacy*  Follow-Up: At [facility], you and your health needs are our priority.  As part of our continuing mission to provide you with exceptional heart care, we have created designated Provider Care Teams.  These Care Teams include your primary Cardiologist (physician) and Advanced Practice Providers (APPs -  Physician Assistants and Nurse Practitioners) who all work together to provide you with the care you need, when you need it.  We recommend signing up for the patient portal called "MyChart".  Sign up information is provided on this After Visit Summary.  MyChart is used to connect with patients for Virtual Visits (Telemedicine).  Patients are able to view lab/test results, encounter notes, upcoming appointments, etc.  Non-urgent messages can be sent to your provider as well.   To learn more about what you can do with MyChart, go to [email or web address].    Your next appointment:   Follow up as scheduled with Dr Antoine Poche in May.   Thank you for choosing [facility]!!

## 2021-02-21 ENCOUNTER — Telehealth: Payer: Self-pay | Admitting: Pulmonary Disease

## 2021-02-21 NOTE — Telephone Encounter (Signed)
-----   Message from Donnamae Jude sent at 02/20/2021 10:29 AM EST ----- Regarding: aide for sleep study I just spoke to Terri at the sleep lab.  She states they still do not have aides to help pt's now at [facility] or [facility] location.  Cordelia Pen ----- Message ----- From: Donnamae Jude Sent: 02/19/2021   4:37 PM EST To: Coralyn Helling, MD, Melonie Florida, RN  I have called the sleep lab to see if they are able to schedule these with an aide and I had to leave them a vm.  Cordelia Pen ----- Message ----- From: Donnamae Jude Sent: 02/19/2021   4:29 PM EST To: Coralyn Helling, MD, Melonie Florida, RN, #  Pt was scheduled on January 18 and it was cancelled per note -      Pt will have to be rescheduled for [facility] when we can get an aide to assist   Cordelia Pen ----- Message ----- From: Coralyn Helling, MD Sent: 02/19/2021   3:27 PM EST To: Melonie Florida, RN, [facility]  Can you find out what happened to CPAP titration study that Ms. Muldrew was supposed to be scheduled for?  Thanks.  V   ----- Message ----- From: [name], MD Sent: 02/13/2021  11:59 AM EST To: Coralyn Helling, MD  Hi,  she did not get her sleep study and they were supposed to reschedule.  Something about she could not get in the bed and they would have nobody to help her.

## 2021-02-21 NOTE — Telephone Encounter (Signed)
Since it doesn't seem that an in lab titration study will be feasible, please arrange for her to be set up with auto CPAP 5 to 20 cm H2O with heated humidity and mask of choice (please check whether she has already received an auto CPAP device).  She will need to do overnight oximetry on auto CPAP to determine if she still needs to use supplemental oxygen at night with CPAP.  Please schedule ROV in 2 months after getting auto CPAP set up.

## 2021-02-22 NOTE — Telephone Encounter (Signed)
Called and spoke with patient about Dr Evlyn Courier recommendation. Patient stated she has tried using CPAP from [place] and had difficulty using machine and the masks and only getting an hour or two of sleep a night and has since returned the CPAP machine back to [place] and would like to go over with Dr Craige Cotta the problems and see what can be done. Scheduled patient for office visit Wednesday 02/27/2021 at 9:45am with Dr Craige Cotta at the [place] office. Patient is declining for CPAP to be ordered at this time until she speaks with Dr Craige Cotta on 02/27/21.

## 2021-02-27 ENCOUNTER — Other Ambulatory Visit: Payer: Self-pay

## 2021-02-27 ENCOUNTER — Ambulatory Visit: Payer: BC Managed Care – PPO | Admitting: Pulmonary Disease

## 2021-02-27 ENCOUNTER — Encounter: Payer: Self-pay | Admitting: Pulmonary Disease

## 2021-02-27 VITALS — BP 148/84 | HR 76 | Temp 97.6°F | Ht 66.0 in | Wt 360.0 lb

## 2021-02-27 DIAGNOSIS — E662 Morbid (severe) obesity with alveolar hypoventilation: Secondary | ICD-10-CM

## 2021-02-27 DIAGNOSIS — J9611 Chronic respiratory failure with hypoxia: Secondary | ICD-10-CM

## 2021-02-27 DIAGNOSIS — G4733 Obstructive sleep apnea (adult) (pediatric): Secondary | ICD-10-CM

## 2021-02-27 DIAGNOSIS — J9612 Chronic respiratory failure with hypercapnia: Secondary | ICD-10-CM | POA: Diagnosis not present

## 2021-02-27 NOTE — Patient Instructions (Signed)
Will arrange for in lab CPAP titration sleep study Will call to arrange for follow up after sleep study reviewed  

## 2021-02-27 NOTE — Progress Notes (Signed)
[facility]  Chief Complaint  Patient presents with  . Follow-up    Tried CPAP for about a month and returned due to having issues with it, wants to speak with DR     Constitutional:  BP (!) 148/84 (BP Location: Left Arm, Cuff Size: Normal)   Pulse 76   Temp 97.6 F (36.4 C) (Other (Comment)) Comment (Src): wrist  Ht 5\' 6"  (1.676 m)   Wt (!) 360 lb (163.3 kg)   LMP 03/25/2013   SpO2 98% Comment: 2L Pulse O2  BMI 58.11 kg/m   Past Medical History:  Diastolic CHF, Nephrolithiasis, HLD, HTN, Hypothyroidism, Lymphedema  Past Surgical History:  She  has a past surgical history that includes Back surgery (1999); LAP BAND SURGERY (2009); Dilation and curettage of uterus (2011); Tonsillectomy (1984); URETEROSCOPY FOR STONE REMOVAL; and Total knee arthroplasty (Left, 05/09/2013).  Brief Summary:  Jocelyn Morris is a 60 y.o. female with obstructive sleep apnea, obesity hypoventilation syndrome, and chronic hypoxic/hypercapnic respiratory failure.      Subjective:   She is here with her husband.  She had auto CPAP set up.  She had trouble sleeping with it.  She also had to pay $350 up front and $75 per month per her DME.  She turned the machine back in.  She has been using 2 liters 24/7.  She was set up for CPAP titration study.  She needs help getting her legs into bed - she was told by the sleep lab there was no one who could help with this and so the titration study wasn't done.  Physical Exam:   Appearance - well kempt, wearing oxygen  ENMT - no sinus tenderness, no oral exudate, no LAN, Mallampati 4 airway, no stridor  Respiratory - equal breath sounds bilaterally, no wheezing or rales  CV - s1s2 regular rate and rhythm, no murmurs  Ext - 2+  Edema, legs in wrap  Skin - no rashes  Psych - normal mood and affect   Pulmonary testing:   ABG 10/10/20 >> pH 7.39, PCO2 82, PO2 55  Chest Imaging:   CT angio chest 10/08/20 >>  large Rt pleural effusion  Sleep Tests:   HST 12/06/20 >> AHI 55.3, SpO2 low 70%  Cardiac Tests:   Echo 10/08/20 >> EF 65 to 70%, grade 1 DD, mod/severe RV dilation and systolic dysfx, PASP 80 mmHg  Doppler legs 10/15/20 >> DVT Rt saphenofemoral junction  Social History:  She  reports that she has never smoked. She has never used smokeless tobacco. She reports that she does not drink alcohol and does not use drugs.  Family History:  Her family history includes Alcoholism in her father; CAD in her father; Diabetes in her father; Diabetes Mellitus II in her mother; Heart attack in her paternal grandfather; Rheum arthritis in her maternal grandfather; Stroke in her mother.     Assessment/Plan:   Obstructive sleep apnea. 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 - relate to obstructive sleep apnea and obesity  hypoventilation syndrome - continue 2 liters 24/7 - explained how her weight is main factor with her sleep disordered breathing and respiratory failure  Lower extremity DVT. - eliquis through her PCP  Arthritis. - she is to start a prednisone course for acute flare  Time Spent Involved in Patient Care on Day of Examination:  32 minutes  Follow up:  Patient Instructions  Will arrange for in lab CPAP titration sleep study Will call to arrange for follow up after sleep study reviewed    Medication List:   Allergies as of 02/27/2021      Reactions   Keflex [cephalexin]    Upset stomach   Penicillins Swelling, Rash   Swelling to throat .Did it involve  swelling of the face/tongue/throat, SOB, or low BP? Yes Did it involve sudden or severe rash/hives, skin peeling, or any reaction on the inside of your mouth or nose? Yes Did you need to seek medical attention at a hospital or doctor's office? Yes When did it last happen?1981 If all above answers are "NO", may proceed with cephalosporin use.      Medication List       Accurate as of February 27, 2021 10:42 AM. If you have any questions, ask your nurse or doctor.        acetaminophen 500 MG tablet Commonly known as: TYLENOL Take 1-2 tablets by mouth daily as needed.   calcium-vitamin D 250-125 MG-UNIT tablet Commonly known as: OSCAL WITH D Take 1 tablet by mouth daily.   furosemide 40 MG tablet Commonly known as: LASIX Take 40 mg by mouth 2 (two) times daily.   levothyroxine 200 MCG tablet Commonly known as: SYNTHROID Take 200 mcg by mouth daily.   levothyroxine 50 MCG tablet Commonly known as: SYNTHROID Take 50 mcg by mouth daily.   losartan 50 MG tablet Commonly known as: COZAAR Take 50 mg by mouth daily.   multivitamin with minerals Tabs tablet Take 1 tablet by mouth daily.   polycarbophil 625 MG tablet Commonly known as: FIBERCON Take 625 mg by mouth daily.   predniSONE 10 MG tablet Commonly known as: DELTASONE Take 10 mg by mouth daily with breakfast.   rivaroxaban 20 MG Tabs tablet Commonly known as: XARELTO Take 20 mg by mouth daily with supper.   traMADol 50 MG tablet Commonly known as: ULTRAM Take 1-2 tablets (50-100 mg total) by mouth every 6 (six) hours as needed.       Signature:  Coralyn Helling, MD [facility] Care Pager - [phone] 02/27/2021, 10:42 AM

## 2021-03-11 ENCOUNTER — Ambulatory Visit: Payer: BC Managed Care – PPO | Attending: Pulmonary Disease | Admitting: Pulmonary Disease

## 2021-03-11 ENCOUNTER — Other Ambulatory Visit: Payer: Self-pay

## 2021-03-11 DIAGNOSIS — J9612 Chronic respiratory failure with hypercapnia: Secondary | ICD-10-CM

## 2021-03-11 DIAGNOSIS — E662 Morbid (severe) obesity with alveolar hypoventilation: Secondary | ICD-10-CM

## 2021-03-11 DIAGNOSIS — J9611 Chronic respiratory failure with hypoxia: Secondary | ICD-10-CM

## 2021-03-11 DIAGNOSIS — G4736 Sleep related hypoventilation in conditions classified elsewhere: Secondary | ICD-10-CM | POA: Insufficient documentation

## 2021-03-11 DIAGNOSIS — R0902 Hypoxemia: Secondary | ICD-10-CM | POA: Diagnosis not present

## 2021-03-11 DIAGNOSIS — G4733 Obstructive sleep apnea (adult) (pediatric): Secondary | ICD-10-CM | POA: Diagnosis present

## 2021-03-12 ENCOUNTER — Telehealth: Payer: Self-pay | Admitting: Pulmonary Disease

## 2021-03-12 DIAGNOSIS — G4733 Obstructive sleep apnea (adult) (pediatric): Secondary | ICD-10-CM

## 2021-03-12 NOTE — Procedures (Signed)
    Patient Name: Jocelyn Morris, Jocelyn Morris Date: 03/11/2021 Gender: Female D.O.B: [birth year] Age (years): 26 Referring Provider: Coralyn Helling MD, ABSM Height (inches): 66 Interpreting Physician: Coralyn Helling MD, ABSM Weight (lbs): 350 RPSGT: Alfonso Ellis BMI: 56 MRN: [id number] Neck Size: 14.50  CLINICAL INFORMATION The patient is referred for a BiPAP titration to treat sleep apnea.  Date of HST: 12/06/20, AHI 55.3, SpO2 low 70%.   SLEEP STUDY TECHNIQUE As per the AASM Manual for the Scoring of Sleep and Associated Events v2.3 (April 2016) with a hypopnea requiring 4% desaturations.  The channels recorded and monitored were frontal, central and occipital EEG, electrooculogram (EOG), submentalis EMG (chin), nasal and oral airflow, thoracic and abdominal wall motion, anterior tibialis EMG, snore microphone, electrocardiogram, and pulse oximetry. Bilevel positive airway pressure (BPAP) was initiated at the beginning of the study and titrated to treat sleep-disordered breathing.  MEDICATIONS Medications self-administered by patient taken the night of the study : N/A  RESPIRATORY PARAMETERS Optimal IPAP Pressure (cm): 16 AHI at Optimal Pressure (/hr) N/A Optimal EPAP Pressure (cm): 12   Overall Minimal O2 (%): 72.00 Minimal O2 at Optimal Pressure (%): 72.00 SLEEP ARCHITECTURE Start Time: 9:57:41 PM Stop Time: 5:12:19 AM Total Time (min): 434.6 Total Sleep Time (min): 349 Sleep Latency (min): 4.8 Sleep Efficiency (%): 80.3 REM Latency (min): 123.5 WASO (min): 80.8 Stage N1 (%): 8.31 Stage N2 (%): 47.85 Stage N3 (%): 20.20 Stage R (%): 23.6 Supine (%): 100.00 Arousal Index (/hr): 29.6   CARDIAC DATA The 2 lead EKG demonstrated sinus rhythm. The mean heart rate was 63.53 beats per minute. Other EKG findings include: None.  LEG MOVEMENT DATA The total Periodic Limb Movements of Sleep (PLMS) were 0. The PLMS index was 0.00. A PLMS index of <15 is considered normal in  adults.  IMPRESSIONS - CPAP failed to control her sleep apnea. - She did well with Bipap 16/12 cm H2O. - She required the use of 2 liters supplemental oxygen with Bipap.  DIAGNOSIS - Obstructive Sleep Apnea (G47.33) - Nocturnal Hypoxemia (G47.36)  RECOMMENDATIONS - Trial of BiPAP therapy on 16/12 cm H2O with 2 liters supplemental oxygen. - She was fitted with a Small size Resmed Full Face Mask AirFit F20 mask and heated humidification. - Avoid alcohol, sedatives and other CNS depressants that may worsen sleep apnea and disrupt normal sleep architecture. - Sleep hygiene should be reviewed to assess factors that may improve sleep quality. - Weight management and regular exercise should be initiated or continued.  [Electronically signed] 03/12/2021 02:04 PM  Coralyn Helling MD, ABSM Diplomate, American Board of Sleep Medicine   NPI: [id number]

## 2021-03-12 NOTE — Telephone Encounter (Signed)
Bipap 03/11/21 >> Bipap 16/12 with 2 liter O2   Please let her know that she did well with Bipap and 2 liters oxygen during her titration study.  Please arrange for Bipap 16/12 cm H2O with heated humidity and mask of choice, and 2 liters oxygen.  She will need ROV in 3 months after getting Bipap set up.

## 2021-03-13 NOTE — Telephone Encounter (Signed)
Called and went over Titration study results per Dr Craige Cotta with patient. All questions answered and patient agreeable to bipap order (with 2L O2) being placed. Order placed per Dr Craige Cotta. Patient expressed full understanding to call the office to schedule office visit with Dr Craige Cotta 3 months after getting Bipap set up. Nothing further needed at this time.

## 2021-03-18 ENCOUNTER — Telehealth: Payer: Self-pay | Admitting: Pulmonary Disease

## 2021-03-18 DIAGNOSIS — E662 Morbid (severe) obesity with alveolar hypoventilation: Secondary | ICD-10-CM

## 2021-03-18 DIAGNOSIS — G4733 Obstructive sleep apnea (adult) (pediatric): Secondary | ICD-10-CM

## 2021-03-18 NOTE — Telephone Encounter (Signed)
Patient was seen on 02/27/21 by Dr. Craige Cotta who said she would need to have a BiPaP according to the study.   Order placed for supplies to [facility] Nothing further needed at this time.

## 2021-03-26 ENCOUNTER — Telehealth: Payer: Self-pay | Admitting: Pulmonary Disease

## 2021-03-26 DIAGNOSIS — G4733 Obstructive sleep apnea (adult) (pediatric): Secondary | ICD-10-CM

## 2021-03-26 NOTE — Telephone Encounter (Signed)
Called and spoke with patient who stated she would like to get an order for her bipap supplies sent in to [employer] [facility].  Dr Craige Cotta please advise that sending an order would be ok.

## 2021-03-26 NOTE — Telephone Encounter (Signed)
Called and let patient know of Dr Evlyn Courier response and order placed for bipap supplies to [facility] per patient request. Nothing further needed at this time.

## 2021-03-26 NOTE — Telephone Encounter (Signed)
Okay to send order. 

## 2021-04-18 ENCOUNTER — Encounter: Payer: Self-pay | Admitting: *Deleted

## 2021-04-18 ENCOUNTER — Ambulatory Visit: Payer: BC Managed Care – PPO | Admitting: Diagnostic Neuroimaging

## 2021-04-18 ENCOUNTER — Encounter: Payer: Self-pay | Admitting: Diagnostic Neuroimaging

## 2021-04-18 ENCOUNTER — Other Ambulatory Visit: Payer: Self-pay | Admitting: *Deleted

## 2021-04-18 VITALS — BP 144/94 | HR 78 | Ht 66.0 in | Wt 264.1 lb

## 2021-04-18 DIAGNOSIS — G5623 Lesion of ulnar nerve, bilateral upper limbs: Secondary | ICD-10-CM | POA: Diagnosis not present

## 2021-04-18 NOTE — Progress Notes (Signed)
[facility]  PATIENT: Jocelyn Morris DOB: 08-Feb-1961  REFERRING CLINICIAN: Dion Saucier, PA-C HISTORY FROM: patient  REASON FOR VISIT: new consult    HISTORICAL  CHIEF COMPLAINT:  Chief Complaint  Patient presents with  . New Patient (Initial Visit)    Rm 6 with husband   . Numbness    Pt here to discuss worsening numbness/ tingling in bilateral hands. Pt reports sx are primarily in the pinky and ring finger on both hands. Pt reports sx started about 5 weeks ago and are present for most of the day. Pt also reports shoulder/back pain and is currently taking tramadol. Reports it helps some.      HISTORY OF PRESENT ILLNESS:   60 year old female here for evaluation of bilateral hand numbness.  Symptoms started 6 weeks ago with waking up from sleep with 2 to 3 hours of numbness in bilateral hands digits 4 and 5.  Sometimes she would shake and move her hands and warm them up and symptoms would resolve.  Lately symptoms have been more consistent.  Also for the past 4 to 6 months patient has been sleeping directly on her back without moving side to side, using BiPAP at nighttime. He states she was a side sleeper moving side to side.  REVIEW OF SYSTEMS: Full 14 system review of systems performed and negative with exception of: as per HPI.  ALLERGIES: Allergies  Allergen Reactions  . Keflex [Cephalexin]     Upset stomach  . Penicillins Swelling and Rash    Swelling to throat  .Did it involve swelling of the face/tongue/throat, SOB, or low BP? Yes Did it involve sudden or severe rash/hives, skin peeling, or any reaction on the inside of your mouth or nose? Yes Did you need to seek medical attention at a hospital or doctor's office? Yes When did it last happen?1981 If all above answers are "NO", may proceed with cephalosporin use.     HOME MEDICATIONS: Outpatient Medications Prior to Visit  Medication Sig Dispense Refill  . acetaminophen (TYLENOL)  500 MG tablet Take 1-2 tablets by mouth daily as needed.    Marland Kitchen atorvastatin (LIPITOR) 20 MG tablet 20 mg daily.    . calcium-vitamin D (OSCAL WITH D) 250-125 MG-UNIT tablet Take 1 tablet by mouth daily.    . furosemide (LASIX) 40 MG tablet Take 40 mg by mouth 2 (two) times daily.    Marland Kitchen levothyroxine (SYNTHROID) 200 MCG tablet Take 200 mcg by mouth daily.    Marland Kitchen levothyroxine (SYNTHROID) 50 MCG tablet Take 25 mcg by mouth daily. Total 225 mg daily    . losartan (COZAAR) 50 MG tablet Take 50 mg by mouth daily.    . Multiple Vitamin (MULTIVITAMIN WITH MINERALS) TABS tablet Take 1 tablet by mouth daily.    . polycarbophil (FIBERCON) 625 MG tablet Take 625 mg by mouth daily.    . rivaroxaban (XARELTO) 20 MG TABS tablet Take 20 mg by mouth daily with supper.    . traMADol (ULTRAM) 50 MG tablet Take 1-2 tablets (50-100 mg total) by mouth every 6 (six) hours as needed. (Patient taking differently: Take 50 mg by mouth every 6 (six) hours as needed. 1 at bedtime.) 60 tablet 0  . predniSONE (DELTASONE) 10 MG tablet Take 10 mg by mouth daily with breakfast.     No facility-administered medications prior to visit.    PAST MEDICAL HISTORY: Past Medical History:  Diagnosis Date  . Arthritis    LEFT KNEE PAIN  AND OA; S/P LUMBAR FUSION -- SOME BACK STIFFNESS-ESP AFTER LYING DOWN  . Cardiomegaly   . Chronic diastolic heart failure (HCC)   . History of kidney stones   . Hyperlipidemia   . Hypertension   . Hypothyroidism    hypothyroid  . Lymphedema   . OSA (obstructive sleep apnea)   . Osteoarthritis   . Paresthesia    fingers  . Pulmonary hypertension (HCC)     PAST SURGICAL HISTORY: Past Surgical History:  Procedure Laterality Date  . BACK SURGERY  1999   LUMBAR FUSION- 4 CAGES  . DILATION AND CURETTAGE OF UTERUS  2011  . LAP BAND SURGERY  2009  . TONSILLECTOMY  1984  . TOTAL KNEE ARTHROPLASTY Left 05/09/2013   Procedure: LEFT TOTAL KNEE ARTHROPLASTY;  Surgeon: Loanne Drilling, MD;  Location:  [facility];  Service: Orthopedics;  Laterality: Left;  . URETEROSCOPY FOR STONE REMOVAL      FAMILY HISTORY: Family History  Problem Relation Age of Onset  . Diabetes Father   . Alcoholism Father   . CAD Father   . Diabetes Mellitus II Mother   . Stroke Mother   . Rheum arthritis Maternal Grandfather   . Heart attack Paternal Grandfather     SOCIAL HISTORY: Social History   Socioeconomic History  . Marital status: Married    Spouse name: Jillyn Hidden  . Number of children: 0  . Years of education: Not on file  . Highest education level: Not on file  Occupational History    Comment: retired  Tobacco Use  . Smoking status: Never Smoker  . Smokeless tobacco: Never Used  Substance and Sexual Activity  . Alcohol use: Never  . Drug use: Never  . Sexual activity: Not on file  Other Topics Concern  . Not on file  Social History Narrative   Lives with husband   Social Determinants of Health   Financial Resource Strain: Not on file  Food Insecurity: Not on file  Transportation Needs: Not on file  Physical Activity: Not on file  Stress: Not on file  Social Connections: Not on file  Intimate Partner Violence: Not on file     PHYSICAL EXAM  GENERAL EXAM/CONSTITUTIONAL: Vitals:  Vitals:   04/18/21 1600  BP: (!) 144/94  Pulse: 78  SpO2: 98%  Weight: 264 lb 2 oz (119.8 kg)  Height: 5\' 6"  (1.676 m)   Body mass index is 42.63 kg/m. Wt Readings from Last 3 Encounters:  04/18/21 264 lb 2 oz (119.8 kg)  02/27/21 (!) 360 lb (163.3 kg)  02/13/21 (!) 360 lb (163.3 kg)    Patient is in no distress; well developed, nourished and groomed; neck is supple  CARDIOVASCULAR:  Examination of carotid arteries is normal; no carotid bruits  Regular rate and rhythm, no murmurs  Examination of peripheral vascular system by observation and palpation is normal  EYES:  Ophthalmoscopic exam of optic discs and posterior segments is normal; no papilledema or hemorrhages No exam data  present  MUSCULOSKELETAL:  Gait, strength, tone, movements noted in Neurologic exam below  NEUROLOGIC: MENTAL STATUS:  No flowsheet data found.  awake, alert, oriented to person, place and time  recent and remote memory intact  normal attention and concentration  language fluent, comprehension intact, naming intact  fund of knowledge appropriate  CRANIAL NERVE:   2nd - no papilledema on fundoscopic exam  2nd, 3rd, 4th, 6th - pupils equal and reactive to light, visual fields full to confrontation, extraocular muscles intact,  no nystagmus  5th - facial sensation symmetric  7th - facial strength symmetric  8th - hearing intact  9th - palate elevates symmetrically, uvula midline  11th - shoulder shrug symmetric  12th - tongue protrusion midline  MOTOR:   normal bulk and tone, full strength in the BUE, BLE  SENSORY:   normal and symmetric to light touch, pinprick, temperature, vibration; EXCEPT DECR IN DIGITS 4 AND 5  COORDINATION:   finger-nose-finger, fine finger movements normal  REFLEXES:   deep tendon reflexes TRACE and symmetric  GAIT/STATION:   narrow based gait     DIAGNOSTIC DATA (LABS, IMAGING, TESTING) - I reviewed patient records, labs, notes, testing and imaging myself where available.  Lab Results  Component Value Date   WBC 14.3 (H) 05/11/2013   HGB 10.8 (L) 05/11/2013   HCT 33.4 (L) 05/11/2013   MCV 91.8 05/11/2013   PLT 333 05/11/2013      Component Value Date/Time   NA 137 05/11/2013 0335   K 3.8 05/11/2013 0335   CL 102 05/11/2013 0335   CO2 28 05/11/2013 0335   GLUCOSE 119 (H) 05/11/2013 0335   BUN 9 05/11/2013 0335   CREATININE 0.62 05/11/2013 0335   CALCIUM 8.5 05/11/2013 0335   PROT 7.1 04/29/2013 1520   ALBUMIN 3.8 04/29/2013 1520   AST 13 04/29/2013 1520   ALT 10 04/29/2013 1520   ALKPHOS 97 04/29/2013 1520   BILITOT 0.5 04/29/2013 1520   GFRNONAA >90 05/11/2013 0335   GFRAA >90 05/11/2013 0335   No results  found for: CHOL, HDL, LDLCALC, LDLDIRECT, TRIG, CHOLHDL No results found for: VZCH8I No results found for: VITAMINB12 No results found for: TSH     ASSESSMENT AND PLAN  61 y.o. year old female here with:  Dx:  1. Ulnar neuropathy of both upper extremities      PLAN:  POSSIBLE BILATERAL ULNAR NEUROPATHIES AT ELBOWS (supine position with sleeping) - check EMG/NCS - avoid pressure on elbows during sleep (try pillows, towels, foam padding)  Orders Placed This Encounter  Procedures  . NCV with EMG(electromyography)   Return for for NCV/EMG.    Suanne Marker, MD 04/18/2021, 4:41 PM Certified in Neurology, Neurophysiology and Neuroimaging  [facility] [address] [place], [place] [address] [phone]

## 2021-04-25 ENCOUNTER — Ambulatory Visit (INDEPENDENT_AMBULATORY_CARE_PROVIDER_SITE_OTHER): Payer: BC Managed Care – PPO | Admitting: Diagnostic Neuroimaging

## 2021-04-25 ENCOUNTER — Other Ambulatory Visit: Payer: Self-pay

## 2021-04-25 ENCOUNTER — Encounter: Payer: BC Managed Care – PPO | Admitting: Diagnostic Neuroimaging

## 2021-04-25 DIAGNOSIS — G5623 Lesion of ulnar nerve, bilateral upper limbs: Secondary | ICD-10-CM | POA: Diagnosis not present

## 2021-04-25 DIAGNOSIS — Z0289 Encounter for other administrative examinations: Secondary | ICD-10-CM

## 2021-04-25 NOTE — Procedures (Signed)
[facility]  NCS (NERVE CONDUCTION STUDY) WITH EMG (ELECTROMYOGRAPHY) REPORT   STUDY DATE: 04/25/21 PATIENT NAME: Jocelyn Morris DOB: April 17, 1961 MRN: [id number]  ORDERING CLINICIAN: Joycelyn Schmid, MD   TECHNOLOGIST: Durenda Age ELECTROMYOGRAPHER: Glenford Bayley. Jermisha Hoffart, MD  CLINICAL INFORMATION: 60 year old female with hand numbness.  FINDINGS: NERVE CONDUCTION STUDY:  Bilateral median motor responses normal.  Bilateral ulnar motor responses have normal distal latencies, normal amplitudes, slowed conduction velocities with stimulation above the elbows, normal conduction velocity with stimulation below the elbows.  Bilateral median and ulnar sensory responses are normal.  Right ulnar F-wave latency is normal.  Left ulnar F wave latency is slightly prolonged.    NEEDLE ELECTROMYOGRAPHY:  Needle examination of left upper extremity is normal.   IMPRESSION:   Abnormal study demonstrating: -Bilateral compression ulnar neuropathies at the elbows, primarily demyelination.  No active or chronic denervation on needle EMG.     INTERPRETING PHYSICIAN:  Suanne Marker, MD Certified in Neurology, Neurophysiology and Neuroimaging  [facility] [address] [place], [place] [address] [phone]   [facility]    Nerve / Sites Muscle Latency Ref. Amplitude Ref. Rel Amp Segments Distance Velocity Ref. Area    ms ms mV mV %  cm m/s m/s mVms  R Median - APB     Wrist APB 4.3 ?4.4 6.5 ?4.0 100 Wrist - APB 7   24.5     Upper arm APB 8.3  6.0  92.8 Upper arm - Wrist 22 54 ?49 23.6  L Median - APB     Wrist APB 4.1 ?4.4 5.7 ?4.0 100 Wrist - APB 7   19.0     Upper arm APB 8.1  5.4  95.4 Upper arm - Wrist 20 49 ?49 21.1  R Ulnar - ADM     Wrist ADM 2.8 ?3.3 8.2 ?6.0 100 Wrist - ADM 7   18.3     B.Elbow ADM 6.1  7.3  88.3 B.Elbow - Wrist 19 57 ?49 18.1     A.Elbow ADM 8.8  4.1  55.8 A.Elbow - B.Elbow 10 37 ?49 11.3  L Ulnar - ADM      Wrist ADM 2.9 ?3.3 6.0 ?6.0 100 Wrist - ADM 7   15.3     B.Elbow ADM 6.2  2.8  46.7 B.Elbow - Wrist 19 58 ?49 10.8     A.Elbow ADM 9.2  2.7  95.4 A.Elbow - B.Elbow 10 33 ?49 10.6             SNC    Nerve / Sites Rec. Site Peak Lat Ref.  Amp Ref. Segments Distance    ms ms V V  cm  R Median - Orthodromic (Dig II, Mid palm)     Dig II Wrist 3.3 ?3.4 13 ?10 Dig II - Wrist 13  L Median - Orthodromic (Dig II, Mid palm)     Dig II Wrist 3.2 ?3.4 10 ?10 Dig II - Wrist 13  R Ulnar - Orthodromic, (Dig V, Mid palm)     Dig V Wrist 2.6 ?3.1 8 ?5 Dig V - Wrist 11  L Ulnar - Orthodromic, (Dig V, Mid palm)     Dig V Wrist 2.7 ?3.1 6 ?5 Dig V - Wrist 38             F  Wave    Nerve F Lat Ref.   ms ms  R Ulnar - ADM 30.0 ?32.0  L Ulnar -  ADM 32.2 ?32.0         EMG Summary Table    Spontaneous MUAP Recruitment  Muscle IA Fib PSW Fasc Other Amp Dur. Poly Pattern  L. Deltoid Normal None None None _______ Normal Normal Normal Normal  L. Biceps brachii Normal None None None _______ Normal Normal Normal Normal  L. Triceps brachii Normal None None None _______ Normal Normal Normal Normal  L. Flexor carpi radialis Normal None None None _______ Normal Normal Normal Normal  L. Flexor carpi ulnaris Normal None None None _______ Normal Normal Normal Normal  L. First dorsal interosseous Normal None None None _______ Normal Normal Normal Normal

## 2021-05-01 ENCOUNTER — Ambulatory Visit (INDEPENDENT_AMBULATORY_CARE_PROVIDER_SITE_OTHER): Payer: BC Managed Care – PPO

## 2021-05-01 DIAGNOSIS — I5031 Acute diastolic (congestive) heart failure: Secondary | ICD-10-CM | POA: Diagnosis not present

## 2021-05-01 LAB — ECHOCARDIOGRAM COMPLETE
Area-P 1/2: 2.82 cm2
Calc EF: 64.6 %
S' Lateral: 2.84 cm
Single Plane A2C EF: 69.9 %
Single Plane A4C EF: 61.6 %

## 2021-05-05 DIAGNOSIS — E039 Hypothyroidism, unspecified: Secondary | ICD-10-CM | POA: Insufficient documentation

## 2021-05-05 NOTE — Progress Notes (Signed)
Cardiology Office Note   Date:  05/08/2021   ID:  Jocelyn Morris, DOB 10-27-1961, MRN [id number]  PCP:  Juliette Alcide, MD  Cardiologist:   [name], MD Referring:  Juliette Alcide, MD  Chief Complaint  Patient presents with  . Shortness of Breath      History of Present Illness: Jocelyn Morris is a 60 y.o. female who presents for evaluation of pulmonary HTN, diastolic HF and a hospitalization at [facility].   She was admitted for AKI with and respiratory failure with acute on chronic diastolic dysfunction.  She had a CT without PE.  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  She desaturated overnight and was thought to have sleep apnea.  She was also found to have a DVT.  She was discharged to rehab on O2.   Of note she was told to stop Ambien and nabumetone at discharge.  Eliquis and Lasix BID were added at discharge.    She was sent home on 2 liters of O2.     Since I last saw her she has had no new cardiovascular signs.  She wears her oxygen most of the time.  She is walking.  Overall since her hospitalization she has lost about 65 pounds.  She states she is kind of at a plateau that she is trying to eat right.  She is now wearing BiPAP every night.  She denies any new cardiovascular symptoms.  She has no new PND or orthopnea.  She had no new palpitations, presyncope or syncope.  Has had chronic lower extremity swelling.   Past Medical History:  Diagnosis Date  . Arthritis    LEFT KNEE PAIN AND OA; S/P LUMBAR FUSION -- SOME BACK STIFFNESS-ESP AFTER LYING DOWN  . Cardiomegaly   . Chronic diastolic heart failure (HCC)   . History of kidney stones   . Hyperlipidemia   . Hypertension   . Hypothyroidism    hypothyroid  . Lymphedema   . OSA (obstructive sleep apnea)   . Osteoarthritis   . Paresthesia    fingers  . Pulmonary hypertension (HCC)      Past Surgical History:  Procedure Laterality Date  . BACK SURGERY  1999   LUMBAR FUSION- 4 CAGES  . DILATION AND CURETTAGE OF UTERUS  2011  . LAP BAND SURGERY  2009  . TONSILLECTOMY  1984  . TOTAL KNEE ARTHROPLASTY Left 05/09/2013   Procedure: LEFT TOTAL KNEE ARTHROPLASTY;  Surgeon: Loanne Drilling, MD;  Location: [facility];  Service: Orthopedics;  Laterality: Left;  . URETEROSCOPY FOR STONE REMOVAL       Current Outpatient Medications  Medication Sig Dispense Refill  . acetaminophen (TYLENOL) 500 MG tablet Take 1-2 tablets by mouth daily as needed.    Marland Kitchen aspirin EC 81 MG tablet Take 1 tablet (81 mg total) by mouth daily. Swallow whole. 90 tablet 3  . atorvastatin (LIPITOR) 20 MG tablet 20 mg daily.    . calcium-vitamin D (OSCAL WITH D) 250-125 MG-UNIT tablet Take 1 tablet by mouth daily.    . furosemide (LASIX) 40 MG tablet Take 40 mg by mouth 2 (two) times daily.    Marland Kitchen levothyroxine (SYNTHROID) 200 MCG tablet Take 200 mcg by mouth daily.    Marland Kitchen levothyroxine (SYNTHROID) 25 MCG tablet Take 25 mcg by mouth every morning.    Marland Kitchen losartan (COZAAR) 50  MG tablet Take 50 mg by mouth daily.    . Multiple Vitamin (MULTIVITAMIN WITH MINERALS) TABS tablet Take 1 tablet by mouth daily.    . polycarbophil (FIBERCON) 625 MG tablet Take 625 mg by mouth as needed.    . traMADol (ULTRAM) 50 MG tablet Take 1-2 tablets (50-100 mg total) by mouth every 6 (six) hours as needed. (Patient taking differently: Take 50 mg by mouth every 6 (six) hours as needed. 1 at bedtime.) 60 tablet 0   No current facility-administered medications for this visit.    Allergies:   Cymbalta [duloxetine hcl], Keflex [cephalexin], and Penicillins    ROS:  Please see the history of present illness.   Otherwise, review of systems are positive for none.   All other systems are reviewed and negative.    PHYSICAL EXAM: VS:  BP 138/82   Pulse 76   Ht 5\' 6"  (1.676 m)   Wt (!) 364 lb (165.1 kg)   LMP 03/25/2013   SpO2 97%   BMI  58.75 kg/m  , BMI Body mass index is 58.75 kg/m. GENERAL:  Well appearing NECK:  No jugular venous distention, waveform within normal limits, carotid upstroke brisk and symmetric, no bruits, no thyromegaly LUNGS:  Clear to auscultation bilaterally CHEST:  Unremarkable HEART:  PMI not displaced or sustained,S1 and S2 within normal limits, no S3, no S4, no clicks, no rubs, 2 out of 6 apical systolic murmur radiating slightly at the aortic outflow tract, no diastolic murmurs murmurs ABD:  Flat, positive bowel sounds normal in frequency in pitch, no bruits, no rebound, no guarding, no midline pulsatile mass, no hepatomegaly, no splenomegaly EXT:  2 plus pulses throughout, trace pitting edema and mild nonpitting edema edema, no cyanosis no clubbing   EKG:  EKG is not ordered today.    Recent Labs: No results found for requested labs within last 8760 hours.    Lipid Panel No results found for: CHOL, TRIG, HDL, CHOLHDL, VLDL, LDLCALC, LDLDIRECT    Wt Readings from Last 3 Encounters:  05/08/21 (!) 364 lb (165.1 kg)  04/18/21 264 lb 2 oz (119.8 kg)  02/27/21 (!) 360 lb (163.3 kg)      Other studies Reviewed: Additional studies/ records that were reviewed today include:  Labs Review of the above records demonstrates:  Please see elsewhere in the note.     ASSESSMENT AND PLAN:  SLEEP APNEA:   She has been found to have sleep apnea.    She is now getting BiPAP.  No change in therapy.  MORBID OBESITY:    Weight loss encouraged more of the same.  No change in therapy.  CHRONIC DIASTOLIC HF:       She seems to be euvolemic.  Her creatinine was checked in March and was stable.   She understands salt and fluid restriction.  No change in therapy.   DVT: I went through the discussion on provoked versus unprovoked DVT.    I think hers was provoked by immobility and her weight.  She is now completed more than 6 months of therapy and can go to Xarelto 81 mg daily.  She has been much physically  active.  HYPOVENTILATION OBESITY SYNDROME:     There was no RV dysfunction on the follow-up echo.  Pulmonary pressures have normalized.  Weight management, sleep apnea management, salt, fluid restriction and management of her hypertension should resolve this issue.   HYPOTHYROID:   I do note from her primary provider that her TSH  was 0.006 in March.  He has reduced her thyroid replacement and has follow-up with her.   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  No change in therapy.  Current medicines are reviewed at length with the patient today.  The patient does not have concerns regarding medicines.  The following changes have been made: As above  Labs/ tests ordered today include: None  No orders of the defined types were placed in this encounter.    Disposition:   FU with me in 12 months   Signed, [name], MD  05/08/2021 9:33 AM    [facility]

## 2021-05-06 ENCOUNTER — Encounter: Payer: Self-pay | Admitting: *Deleted

## 2021-05-06 NOTE — Telephone Encounter (Signed)
This encounter was created in error - please disregard.

## 2021-05-08 ENCOUNTER — Other Ambulatory Visit: Payer: Self-pay

## 2021-05-08 ENCOUNTER — Encounter: Payer: Self-pay | Admitting: Cardiology

## 2021-05-08 ENCOUNTER — Ambulatory Visit (INDEPENDENT_AMBULATORY_CARE_PROVIDER_SITE_OTHER): Payer: BC Managed Care – PPO | Admitting: Cardiology

## 2021-05-08 VITALS — BP 138/82 | HR 76 | Ht 66.0 in | Wt 364.0 lb

## 2021-05-08 DIAGNOSIS — E039 Hypothyroidism, unspecified: Secondary | ICD-10-CM | POA: Diagnosis not present

## 2021-05-08 DIAGNOSIS — I5031 Acute diastolic (congestive) heart failure: Secondary | ICD-10-CM | POA: Diagnosis not present

## 2021-05-08 DIAGNOSIS — G4733 Obstructive sleep apnea (adult) (pediatric): Secondary | ICD-10-CM

## 2021-05-08 DIAGNOSIS — I82409 Acute embolism and thrombosis of unspecified deep veins of unspecified lower extremity: Secondary | ICD-10-CM | POA: Diagnosis not present

## 2021-05-08 MED ORDER — ASPIRIN EC 81 MG PO TBEC: 81.0000 mg | DELAYED_RELEASE_TABLET | Freq: Every day | ORAL | 3 refills | Status: AC

## 2021-05-08 NOTE — Patient Instructions (Signed)
Medication Instructions:  Please discontinue Xarelto when finished with current bottle.  Start Aspirin 81 mg a day. Continue all other medications as listed.  *If you need a refill on your cardiac medications before your next appointment, please call your pharmacy*  Follow-Up: At [facility], you and your health needs are our priority.  As part of our continuing mission to provide you with exceptional heart care, we have created designated Provider Care Teams.  These Care Teams include your primary Cardiologist (physician) and Advanced Practice Providers (APPs -  Physician Assistants and Nurse Practitioners) who all work together to provide you with the care you need, when you need it.  We recommend signing up for the patient portal called "MyChart".  Sign up information is provided on this After Visit Summary.  MyChart is used to connect with patients for Virtual Visits (Telemedicine).  Patients are able to view lab/test results, encounter notes, upcoming appointments, etc.  Non-urgent messages can be sent to your provider as well.   To learn more about what you can do with MyChart, go to [email or web address].    Your next appointment:   12 month(s)  The format for your next appointment:   In Person  Provider:   [name], MD   Thank you for choosing [facility]!!

## 2021-05-09 ENCOUNTER — Telehealth: Payer: Self-pay | Admitting: Diagnostic Neuroimaging

## 2021-05-09 DIAGNOSIS — G5623 Lesion of ulnar nerve, bilateral upper limbs: Secondary | ICD-10-CM

## 2021-05-09 NOTE — Telephone Encounter (Signed)
LVM: Pt called, had not had any good results with wearing the brace. Now my hands are staying numb almost all the time. I think I need to see about getting something done. Would like a call from the nurse.

## 2021-05-09 NOTE — Telephone Encounter (Signed)
Spoke with patient who stated elbow length braces she wears at night aren't helping at all. She would like a surgery consult. I advised will let MD know. Patient verbalized understanding, appreciation.

## 2021-05-09 NOTE — Telephone Encounter (Signed)
Referral placed; called patient and advised her referral placed, gave her their #. Patient verbalized understanding, appreciation.

## 2021-05-09 NOTE — Telephone Encounter (Signed)
May consider hand surgery clinic consult. -VRP

## 2021-05-15 NOTE — Telephone Encounter (Signed)
Sent referral via fax to the [facility]. Phone: [phone]. Fax: [phone]

## 2021-05-20 ENCOUNTER — Other Ambulatory Visit: Payer: Self-pay | Admitting: Orthopedic Surgery

## 2021-05-21 ENCOUNTER — Telehealth: Payer: Self-pay | Admitting: *Deleted

## 2021-05-21 NOTE — Telephone Encounter (Signed)
Notes faxed to surgeon. This phone note will be removed from the preop pool. Tereso Newcomer, PA-C  05/21/2021 3:06 PM

## 2021-05-21 NOTE — Telephone Encounter (Signed)
   Name: Jocelyn Morris DOB: [birth year]  MRN: [id number]  Primary Cardiologist: [name], MD  Chart reviewed as part of pre-operative protocol coverage.   60 y.o. female with . Pulmonary hypertension . HFpEF . Obesity hypoventilation syndrome . DVT o Anticoagulation DC'd in 5/2 after 6 months of therapy . OSA on BiPAP . Hypertension . Hyperlipidemia . Obesity s/p lap band . Echocardiogram 05/01/2021: Normal EF, normal PASP, no significant valve disease  Last OV: 05/08/2021 with Dr. Antoine Poche Procedure: Decompression and possible transposition of left ulnar nerve Rx: Hold aspirin  RCRI:  Perioperative Risk of Major Cardiac Event is (%): 0.9 (low risk) DASI:  Functional Capacity in METs is: 4.31 (functional status is good )  Patient was contacted 05/21/2021 in reference to pre-operative risk assessment for pending surgery as outlined below.    Since last seen, Jocelyn Morris has done well without significant changes in her breathing or chest discomfort.  Recommendations: . Based on ACC/AHA guidelines, the patient is at acceptable risk for the planned procedure and may proceed without further cardiovascular testing.  Marland Kitchen She will finish taking her rivaroxaban (Xarelto) in the next 4 days.  She will then transition to aspirin.  Given her prior history of DVT, ideally, she should remain on aspirin without interruption throughout the perioperative period.  However, if the bleeding risk is too great, aspirin can be held up to 7 days prior to surgery and resumed postop as soon as it is felt to be safe.   Please call with questions. Tereso Newcomer, PA-C 05/21/2021, 3:03 PM

## 2021-05-21 NOTE — Telephone Encounter (Signed)
   [facility] Pre-operative Risk Assessment    Patient Name: Jocelyn Morris  DOB: 1961-12-04  MRN: [id number]     Request for surgical clearance:  1. What type of surgery is being performed?  Decompression possible transposition left ulnar nerve elbow  2. When is this surgery scheduled?  06/25/21  [facility]  3. What type of clearance is required (medical clearance vs. Pharmacy clearance to hold med vs. Both)? Medical  4. Are there any medications that need to be held prior to surgery and how long? completing Xarelto then starting Asiprin 81 mg  5. Practice name and name of physician performing surgery? [facility] [place] ; Dr Daryll Brod  6. What is the office phone number? [phone]   7.   What is the office fax number? [phone]  8.   Anesthesia type (None, local, MAC, general) ? Axillary block   Raiford Simmonds 05/21/2021, 11:34 AM  _________________________________________________________________   (provider comments below)

## 2021-06-03 ENCOUNTER — Ambulatory Visit: Payer: BC Managed Care – PPO | Admitting: Pulmonary Disease

## 2021-06-03 ENCOUNTER — Other Ambulatory Visit: Payer: Self-pay

## 2021-06-03 ENCOUNTER — Encounter: Payer: Self-pay | Admitting: Pulmonary Disease

## 2021-06-03 VITALS — BP 122/76 | HR 68 | Temp 97.3°F | Ht 66.0 in | Wt 379.6 lb

## 2021-06-03 DIAGNOSIS — E662 Morbid (severe) obesity with alveolar hypoventilation: Secondary | ICD-10-CM | POA: Diagnosis not present

## 2021-06-03 DIAGNOSIS — J9611 Chronic respiratory failure with hypoxia: Secondary | ICD-10-CM

## 2021-06-03 DIAGNOSIS — G4733 Obstructive sleep apnea (adult) (pediatric): Secondary | ICD-10-CM

## 2021-06-03 DIAGNOSIS — J9612 Chronic respiratory failure with hypercapnia: Secondary | ICD-10-CM

## 2021-06-03 NOTE — Progress Notes (Signed)
[facility]  Chief Complaint  Patient presents with  . Follow-up    "I'm doing ok"    Constitutional:  BP 122/76 (BP Location: Left Arm, Cuff Size: Normal)   Pulse 68   Temp (!) 97.3 F (36.3 C) (Temporal)   Ht 5\' 6"  (1.676 m)   Wt (!) 379 lb 9.6 oz (172.2 kg)   LMP 03/25/2013   SpO2 99% Comment: 2 Liters Pulse O2  BMI 61.27 kg/m   Past Medical History:  Diastolic CHF, Nephrolithiasis, HLD, HTN, Hypothyroidism, Lymphedema  Past Surgical History:  She  has a past surgical history that includes Back surgery (1999); LAP BAND SURGERY (2009); Dilation and curettage of uterus (2011); Tonsillectomy (1984); URETEROSCOPY FOR STONE REMOVAL; and Total knee arthroplasty (Left, 05/09/2013).  Brief Summary:  Jocelyn Morris is a 60 y.o. female with obstructive sleep apnea, obesity hypoventilation syndrome, and chronic hypoxic/hypercapnic respiratory failure.      Subjective:   She is here with her husband.  Weight up 19 lbs since visit in March.  She had previous lap band that had to be revised.  She has been trying weight watchers.  She uses Bipap nightly.  Gets mask leak and has to tighten her mask.  Otherwise working well.  Using 2 liters 24/7.  Still waiting for POC.  Has b/l ulnar neuropathy.  Has decompression scheduled later this month.  Physical Exam:   Appearance - well kempt, wearing oxygen  ENMT - no sinus tenderness, no oral exudate, no LAN, Mallampati 4 airway, no stridor  Respiratory - equal breath sounds bilaterally, no wheezing or rales  CV - s1s2 regular rate and rhythm, no murmurs  Ext - no clubbing, no edema  Skin - no rashes  Psych - normal mood and affect    Pulmonary testing:   ABG 10/10/20 >> pH 7.39, PCO2 82, PO2 55  Chest Imaging:   CT angio chest 10/08/20 >> large Rt pleural effusion  Sleep Tests:   HST 12/06/20 >> AHI 55.3, SpO2 low 70%  Bipap 03/11/21 >> Bipap 16/12 with 2 liter O2  Bipap  05/04/21 to 06/02/21 >> used on 30 of 30 nights with average 6 hrs 35 min.  Average AHI 2.3 with Bipap 16/12 cm H2O  Cardiac Tests:   Echo 10/08/20 >> EF 65 to 70%, grade 1 DD, mod/severe RV dilation and systolic dysfx, PASP 80 mmHg  Doppler legs 10/15/20 >> DVT Rt saphenofemoral junction  Echo 05/01/21 >> EF 60 to 65%, mild LVH, RVSP 15.2 mmHg, mild LA dilation  Social History:  She  reports that she has never smoked. She has never used smokeless tobacco. She reports that she does not drink alcohol and does not use drugs.  Family History:  Her family history includes Alcoholism in her father; CAD in her father; Diabetes in her father; Diabetes Mellitus II in her mother; Heart attack in her paternal grandfather; Rheum arthritis in her maternal grandfather; Stroke in her mother.     Assessment/Plan:   Obstructive sleep apnea. - she is compliant with Bipap and reports benefit from therapy - [facility] - will have her Bipap changed to 14/10 cm H2O; if she still has trouble with mask leak, then might need to refit her mask  Chronic hypoxic and hypercapnic respiratory failure. - relate to obstructive sleep apnea and obesity hypoventilation syndrome - continue 2 liters oxygen 24/7  Obesity. - she had previous lap band that was revised -  provider her with information for [employer] weight loss plan  Ulnar neuropathy. - she has decompression scheduled with Dr. Merlyn Lot later this week - procedure to be done with local anesthesia - no pulmonary contra-indications for her to proceed with surgery  Time Spent Involved in Patient Care on Day of Examination:  25 minutes  Follow up:  Patient Instructions  Will have [facility] change your Bipap setting to 14/10 cm water pressure  You can look up [employer] weight loss plan on line  Follow up in 1 year   Medication List:   Allergies as of 06/03/2021      Reactions   Cymbalta [duloxetine Hcl] Other (See  Comments)   Pt states it gave her bad headache   Keflex [cephalexin]    Upset stomach   Penicillins Swelling, Rash   Swelling to throat .Did it involve swelling of the face/tongue/throat, SOB, or low BP? Yes Did it involve sudden or severe rash/hives, skin peeling, or any reaction on the inside of your mouth or nose? Yes Did you need to seek medical attention at a hospital or doctor's office? Yes When did it last happen?1981 If all above answers are "NO", may proceed with cephalosporin use.      Medication List       Accurate as of June 03, 2021  9:27 AM. If you have any questions, ask your nurse or doctor.        acetaminophen 500 MG tablet Commonly known as: TYLENOL Take 1-2 tablets by mouth daily as needed.   aspirin EC 81 MG tablet Take 1 tablet (81 mg total) by mouth daily. Swallow whole.   atorvastatin 20 MG tablet Commonly known as: LIPITOR 20 mg daily.   calcium-vitamin D 250-125 MG-UNIT tablet Commonly known as: OSCAL WITH D Take 1 tablet by mouth daily.   furosemide 40 MG tablet Commonly known as: LASIX Take 40 mg by mouth 2 (two) times daily.   levothyroxine 200 MCG tablet Commonly known as: SYNTHROID Take 200 mcg by mouth daily.   levothyroxine 25 MCG tablet Commonly known as: SYNTHROID Take 25 mcg by mouth every morning.   losartan 50 MG tablet Commonly known as: COZAAR Take 50 mg by mouth daily.   multivitamin with minerals Tabs tablet Take 1 tablet by mouth daily.   polycarbophil 625 MG tablet Commonly known as: FIBERCON Take 625 mg by mouth as needed.   traMADol 50 MG tablet Commonly known as: ULTRAM Take 1-2 tablets (50-100 mg total) by mouth every 6 (six) hours as needed. What changed:   how much to take  additional instructions       Signature:  Coralyn Helling, MD [facility] Pager - [phone] 06/03/2021, 9:27 AM

## 2021-06-03 NOTE — Patient Instructions (Signed)
Will have [facility] change your Bipap setting to 14/10 cm water pressure  You can look up [employer] weight loss plan on line  Follow up in 1 year

## 2021-06-17 NOTE — Progress Notes (Signed)
Surgical Instructions    Your procedure is scheduled on Tuesday June 28th.  Report to [facility] Main Entrance "A" at 12:45 P.M., then check in with the Admitting office.  Call this number if you have problems the morning of surgery:  [phone]   If you have any questions prior to your surgery date call [phone]: Open Monday-Friday 8am-4pm    Remember:  Do not eat after midnight the night before your surgery  You may drink clear liquids until 11:45am the morning of your surgery.   Clear liquids allowed are: Water, Non-Citrus Juices (without pulp), Carbonated Beverages, Clear Tea, Black Coffee Only, and Gatorade   Enhanced Recovery after Surgery for Orthopedics Enhanced Recovery after Surgery is a protocol used to improve the stress on your body and your recovery after surgery.  Patient Instructions  The day of surgery (if you do NOT have diabetes):  Drink ONE (1) Pre-Surgery Clear Ensure by __11:45___ am the morning of surgery   This drink was given to you during your hospital  pre-op appointment visit. Nothing else to drink after completing the  Pre-Surgery Clear Ensure.         If you have questions, please contact your surgeon's office.     Take these medicines the morning of surgery with A SIP OF WATER  levothyroxine (SYNTHROID) 200 MCG tablet  IF NEEDED traMADol (ULTRAM) 50 MG tablet    As of today, STOP taking any Aspirin (unless otherwise instructed by your surgeon) Aleve, Naproxen, Ibuprofen, Motrin, Advil, Goody's, BC's, all herbal medications, fish oil, and all vitamins.          Do not wear jewelry or makeup Do not wear lotions, powders, perfumes, or deodorant. Do not shave 48 hours prior to surgery.   Do not bring valuables to the hospital. DO Not wear nail polish, gel polish, artificial nails, or any other type of covering on  natural nails including finger and toenails. If patients have artificial nails, gel coating, etc. that need to be removed by  a nail salon please have this removed prior to surgery or surgery may need to be canceled/delayed if the surgeon/ anesthesia feels like the patient is unable to be adequately monitored.             [facility] is not responsible for any belongings or valuables.  Do NOT Smoke (Tobacco/Vaping) or drink Alcohol 24 hours prior to your procedure If you use a CPAP at night, you may bring all equipment for your overnight stay.   Contacts, glasses, dentures or bridgework may not be worn into surgery, please bring cases for these belongings   For patients admitted to the hospital, discharge time will be determined by your treatment team.   Patients discharged the day of surgery will not be allowed to drive home, and someone needs to stay with them for 24 hours.  ONLY 1 SUPPORT PERSON MAY BE PRESENT WHILE YOU ARE IN SURGERY. IF YOU ARE TO BE ADMITTED ONCE YOU ARE IN YOUR ROOM YOU WILL BE ALLOWED TWO (2) VISITORS.  Minor children may have two parents present. Special consideration for safety and communication needs will be reviewed on a case by case basis.  Special instructions:    Oral Hygiene is also important to reduce your risk of infection.  Remember - BRUSH YOUR TEETH THE MORNING OF SURGERY WITH YOUR REGULAR TOOTHPASTE   [facility]- Preparing For Surgery  Before surgery, you can play an important role. Because skin is not sterile,  your skin needs to be as free of germs as possible. You can reduce the number of germs on your skin by washing with CHG (chlorahexidine gluconate) Soap before surgery.  CHG is an antiseptic cleaner which kills germs and bonds with the skin to continue killing germs even after washing.     Please do not use if you have an allergy to CHG or antibacterial soaps. If your skin becomes reddened/irritated stop using the CHG.  Do not shave (including legs and underarms) for at least 48 hours prior to first CHG shower. It is OK to shave your face.  Please follow these  instructions carefully.     Shower the NIGHT BEFORE SURGERY and the MORNING OF SURGERY with CHG Soap.   If you chose to wash your hair, wash your hair first as usual with your normal shampoo. After you shampoo, rinse your hair and body thoroughly to remove the shampoo.  Then [employer] and genitals (private parts) with your normal soap and rinse thoroughly to remove soap.  After that Use CHG Soap as you would any other liquid soap. You can apply CHG directly to the skin and wash gently with a scrungie or a clean washcloth.   Apply the CHG Soap to your body ONLY FROM THE NECK DOWN.  Do not use on open wounds or open sores. Avoid contact with your eyes, ears, mouth and genitals (private parts). Wash Face and genitals (private parts)  with your normal soap.   Wash thoroughly, paying special attention to the area where your surgery will be performed.  Thoroughly rinse your body with warm water from the neck down.  DO NOT shower/wash with your normal soap after using and rinsing off the CHG Soap.  Pat yourself dry with a CLEAN TOWEL.  Wear CLEAN PAJAMAS to bed the night before surgery  Place CLEAN SHEETS on your bed the night before your surgery  DO NOT SLEEP WITH PETS.   Day of Surgery:  Take a shower with CHG soap. Wear Clean/Comfortable clothing the morning of surgery Do not apply any deodorants/lotions.   Remember to brush your teeth WITH YOUR REGULAR TOOTHPASTE.   Please read over the following fact sheets that you were given.

## 2021-06-18 ENCOUNTER — Other Ambulatory Visit: Payer: Self-pay

## 2021-06-18 ENCOUNTER — Encounter (HOSPITAL_COMMUNITY)
Admission: RE | Admit: 2021-06-18 | Discharge: 2021-06-18 | Disposition: A | Discharge status: Home / Self Care | Payer: BC Managed Care – PPO | Source: Ambulatory Visit | Attending: Orthopedic Surgery | Admitting: Orthopedic Surgery

## 2021-06-18 ENCOUNTER — Encounter (HOSPITAL_COMMUNITY): Payer: Self-pay

## 2021-06-18 DIAGNOSIS — Z01812 Encounter for preprocedural laboratory examination: Secondary | ICD-10-CM | POA: Insufficient documentation

## 2021-06-18 LAB — BASIC METABOLIC PANEL
Anion gap: 8 (ref 5–15)
BUN: 19 mg/dL (ref 6–20)
CO2: 34 mmol/L — ABNORMAL HIGH (ref 22–32)
Calcium: 9.5 mg/dL (ref 8.9–10.3)
Chloride: 97 mmol/L — ABNORMAL LOW (ref 98–111)
Creatinine, Ser: 0.71 mg/dL (ref 0.44–1.00)
GFR, Estimated: >60 mL/min (ref >60)
Glucose, Bld: 105 mg/dL — ABNORMAL HIGH (ref 70–99)
Potassium: 3.9 mmol/L (ref 3.5–5.1)
Sodium: 139 mmol/L (ref 135–145)

## 2021-06-18 LAB — CBC
HCT: 40 % (ref 36.0–46.0)
Hemoglobin: 13.2 g/dL (ref 12.0–15.0)
MCH: 32 pg (ref 26.0–34.0)
MCHC: 33 g/dL (ref 30.0–36.0)
MCV: 96.9 fL (ref 80.0–100.0)
Platelets: 326 10*3/uL (ref 150–400)
RBC: 4.13 MIL/uL (ref 3.87–5.11)
RDW: 11.7 % (ref 11.5–15.5)
WBC: 7.2 10*3/uL (ref 4.0–10.5)
nRBC: 0 % (ref 0.0–0.2)

## 2021-06-18 MED ORDER — CLINDAMYCIN PHOSPHATE 900 MG/50ML IV SOLN: 900.0000 mg | INTRAVENOUS | Status: DC

## 2021-06-18 NOTE — Progress Notes (Signed)
PCP - Quintin Alto, MD, [facility] Cardiologist - Dr. [name]   PPM/ICD - denies Device Orders -  Rep Notified -   Chest x-ray - 01/01/21 EKG - 11/30/20 Stress Test - denies ECHO - 05/01/21 Cardiac Cath - denies  Sleep Study - 03/12/21 CPAP - Bipap  Fasting Blood Sugar - n/a Checks Blood Sugar _____ times a day  Blood Thinner Instructions:n/a Aspirin Instructions:stop as of today unless otherwise instructed by your surgeon.  ERAS Protcol -clear liquids until 1145 PRE-SURGERY Ensure or G2- Ensure  COVID TEST- n/a- posted as ambulatory case   Anesthesia review:   Patient denies shortness of breath, fever, cough and chest pain at PAT appointment   All instructions explained to the patient, with a verbal understanding of the material. Patient agrees to go over the instructions while at home for a better understanding. Patient also instructed to self quarantine after being tested for COVID-19. The opportunity to ask questions was provided.

## 2021-06-19 NOTE — Anesthesia Preprocedure Evaluation (Addendum)
Anesthesia Evaluation  Patient identified by MRN, date of birth, ID band Patient awake    Reviewed: Allergy & Precautions, NPO status , Patient's Chart, lab work & pertinent test results  Airway Mallampati: II  TM Distance: >3 FB Neck ROM: Full    Dental  (+) Dental Advisory Given, Teeth Intact   Pulmonary sleep apnea , pneumonia,    breath sounds clear to auscultation + decreased breath sounds      Cardiovascular hypertension, Pt. on medications pulmonary hypertensionNormal cardiovascular exam Rhythm:Regular Rate:Normal  Echo 04/2021 1. Left ventricular ejection fraction, by estimation, is 60 to 65%. The left ventricle has normal function. The left ventricle has no regional wall motion abnormalities. There is mild left ventricular hypertrophy. Left ventricular diastolic parameters were normal. Normal global longitudinal strain of -20.1%.  2. Right ventricular systolic function is normal. The right ventricular size is normal. There is normal pulmonary artery systolic pressure. The estimated right ventricular systolic pressure is 15.2 mmHg.  3. Left atrial size was mildly dilated.  4. The mitral valve is grossly normal. Trivial mitral valve  regurgitation.  5. The aortic valve is tricuspid. Aortic valve regurgitation is not visualized. Mild aortic valve sclerosis is present, with no evidence of aortic valve stenosis.  6. The inferior vena cava is normal in size with greater than 50% respiratory variability, suggesting right atrial pressure of 3 mmHg.    Neuro/Psych negative neurological ROS     GI/Hepatic negative GI ROS, Neg liver ROS,   Endo/Other  Hypothyroidism Morbid obesity  Renal/GU Renal disease     Musculoskeletal  (+) Arthritis ,   Abdominal (+) + obese,   Peds  Hematology  (+) Blood dyscrasia, anemia ,   Anesthesia Other Findings   Reproductive/Obstetrics                           Anesthesia Physical Anesthesia Plan  ASA: 4  Anesthesia Plan: Regional   Post-op Pain Management:    Induction:   PONV Risk Score and Plan: 2 and Propofol infusion, Treatment may vary due to age or medical condition and Ondansetron  Airway Management Planned: Natural Airway  Additional Equipment: None  Intra-op Plan:   Post-operative Plan:   Informed Consent: I have reviewed the patients History and Physical, chart, labs and discussed the procedure including the risks, benefits and alternatives for the proposed anesthesia with the patient or authorized representative who has indicated his/her understanding and acceptance.     Dental advisory given  Plan Discussed with: CRNA  Anesthesia Plan Comments: (PAT note by Antionette Poles, PA-C: Follows with pulmonologist Dr. Craige Cotta for history of OSA on BiPAP, obesity hypoventilation syndrome, chronic hypoxic/hypercapnic respiratory failure on 2 L continuous oxygen.  Last seen 06/03/2021.  Upcoming surgery was discussed.  Per note, "she has decompression scheduled with Dr. Merlyn Lot later this week.  Procedure to be done with local anesthesia.  No pulmonary contraindications for her to proceed with surgery."  Cardiac history as well as cardiac clearance addressed in telephone encounter by Tereso Newcomer, PA-C on 05/21/2021: 60 y.o.femalewith 1. Pulmonary hypertension 2. HFpEF 3. Obesity hypoventilation syndrome 4. DVT  o Anticoagulation DC'd in 5/2after6 months of therapy 5. OSA on BiPAP 6. Hypertension 7. Hyperlipidemia 8. Obesity s/p lap band 9. Echocardiogram 05/01/2021: Normal EF, normal PASP, no significant valve disease  Last OV:5/11/2022with Dr. Antoine Poche Procedure:Decompression and possible transposition of left ulnar nerve KD:XIPJ aspirin  RCRI:Perioperative Risk of Major Cardiac Event is (%): 0.9(lowrisk)  DASI:Functional Capacity in METs is: 4.31(functional status is good)  Patient was  contacted5/24/2022in reference to pre-operative risk assessment for pending surgery as outlined below.   Since last seen,Jocelyn C Joycehas done well without significant changes in her breathing or chest discomfort.  Recommendations: . Based on ACC/AHA guidelines, the patient is at acceptable risk for the planned procedure and may proceed without further cardiovascular testing. Marland Kitchen She will finish taking her rivaroxaban (Xarelto) in the next 4 days. She will then transition to aspirin. Given her prior history of DVT, ideally, she should remain on aspirin without interruption throughout the perioperative period. However, if the bleeding risk is too great, aspirin canbe held up to 7 days prior to surgery and resumed postop as soon as it is felt to be safe.   History of gastric banding, s/p removal on 02/18/2019.  Preop labs reviewed, unremarkable.  EKG 11/30/2020: Sinus rhythm with fusion complexes.  Rate 76.  CHEST - 2 VIEW 01/01/21: COMPARISON: October 07, 2020.  FINDINGS: The heart size and mediastinal contours are within normal limits. Both lungs are clear. No pneumothorax or pleural effusion is noted. The visualized skeletal structures are unremarkable.  IMPRESSION: No active cardiopulmonary disease.  TTE 05/01/2021: 1. Left ventricular ejection fraction, by estimation, is 60 to 65%. The  left ventricle has normal function. The left ventricle has no regional  wall motion abnormalities. There is mild left ventricular hypertrophy.  Left ventricular diastolic parameters  were normal. Normal global longitudinal strain of -20.1%.  2. Right ventricular systolic function is normal. The right ventricular  size is normal. There is normal pulmonary artery systolic pressure. The  estimated right ventricular systolic pressure is 15.2 mmHg.  3. Left atrial size was mildly dilated.  4. The mitral valve is grossly normal. Trivial mitral valve  regurgitation.  5. The aortic  valve is tricuspid. Aortic valve regurgitation is not  visualized. Mild aortic valve sclerosis is present, with no evidence of  aortic valve stenosis.  6. The inferior vena cava is normal in size with greater than 50%  respiratory variability, suggesting right atrial pressure of 3 mmHg.   Comparison(s): Echocardiogram done 10/08/20 showed an EF of 65-70%.   )      Anesthesia Quick Evaluation

## 2021-06-19 NOTE — Progress Notes (Signed)
Anesthesia Chart Review:  Follows with pulmonologist Dr. Craige Cotta for history of OSA on BiPAP, obesity hypoventilation syndrome, chronic hypoxic/hypercapnic respiratory failure on 2 L continuous oxygen.  Last seen 06/03/2021.  Upcoming surgery was discussed.  Per note, "she has decompression scheduled with Dr. Merlyn Lot later this week.  Procedure to be done with local anesthesia.  No pulmonary contraindications for her to proceed with surgery."  Cardiac history as well as cardiac clearance addressed in telephone encounter by Tereso Newcomer, PA-C on 05/21/2021: 60 y.o. female with Pulmonary hypertension HFpEF Obesity hypoventilation syndrome DVT Anticoagulation DC'd in 5/2 after 6 months of therapy OSA on BiPAP Hypertension Hyperlipidemia Obesity s/p lap band Echocardiogram 05/01/2021: Normal EF, normal PASP, no significant valve disease   Last OV: 05/08/2021 with Dr. Antoine Poche Procedure: Decompression and possible transposition of left ulnar nerve Rx: Hold aspirin   RCRI:  Perioperative Risk of Major Cardiac Event is (%): 0.9 (low risk) DASI:  Functional Capacity in METs is: 4.31 (functional status is good )   Patient was contacted 05/21/2021 in reference to pre-operative risk assessment for pending surgery as outlined below.     Since last seen, Jocelyn Morris has done well without significant changes in her breathing or chest discomfort.   Recommendations: Based on ACC/AHA guidelines, the patient is at acceptable risk for the planned procedure and may proceed without further cardiovascular testing.  She will finish taking her rivaroxaban (Xarelto) in the next 4 days.  She will then transition to aspirin.  Given her prior history of DVT, ideally, she should remain on aspirin without interruption throughout the perioperative period.  However, if the bleeding risk is too great, aspirin can be held up to 7 days prior to surgery and resumed postop as soon as it is felt to be safe.   History of gastric  banding, s/p removal on 02/18/2019.  Preop labs reviewed, unremarkable.  EKG 11/30/2020: Sinus rhythm with fusion complexes.  Rate 76.  CHEST - 2 VIEW 01/01/21: COMPARISON:  October 07, 2020.   FINDINGS: The heart size and mediastinal contours are within normal limits. Both lungs are clear. No pneumothorax or pleural effusion is noted. The visualized skeletal structures are unremarkable.   IMPRESSION: No active cardiopulmonary disease.   TTE 05/01/2021:  1. Left ventricular ejection fraction, by estimation, is 60 to 65%. The  left ventricle has normal function. The left ventricle has no regional  wall motion abnormalities. There is mild left ventricular hypertrophy.  Left ventricular diastolic parameters  were normal. Normal global longitudinal strain of -20.1%.   2. Right ventricular systolic function is normal. The right ventricular  size is normal. There is normal pulmonary artery systolic pressure. The  estimated right ventricular systolic pressure is 15.2 mmHg.   3. Left atrial size was mildly dilated.   4. The mitral valve is grossly normal. Trivial mitral valve  regurgitation.   5. The aortic valve is tricuspid. Aortic valve regurgitation is not  visualized. Mild aortic valve sclerosis is present, with no evidence of  aortic valve stenosis.   6. The inferior vena cava is normal in size with greater than 50%  respiratory variability, suggesting right atrial pressure of 3 mmHg.   Comparison(s): Echocardiogram done 10/08/20 showed an EF of 65-70%.    Jocelyn Morris [facility]/Anesthesiology Phone [phone] 06/19/2021 12:25 PM

## 2021-06-25 ENCOUNTER — Ambulatory Visit (HOSPITAL_COMMUNITY): Payer: BC Managed Care – PPO | Admitting: Vascular Surgery

## 2021-06-25 ENCOUNTER — Ambulatory Visit (HOSPITAL_COMMUNITY)
Admission: RE | Admit: 2021-06-25 | Discharge: 2021-06-25 | Disposition: A | Discharge status: Home / Self Care | Payer: BC Managed Care – PPO | Attending: Orthopedic Surgery | Admitting: Orthopedic Surgery

## 2021-06-25 ENCOUNTER — Other Ambulatory Visit: Payer: Self-pay

## 2021-06-25 ENCOUNTER — Encounter (HOSPITAL_COMMUNITY)
Admission: RE | Disposition: A | Discharge status: Home / Self Care | Payer: Self-pay | Source: Home / Self Care | Attending: Orthopedic Surgery

## 2021-06-25 ENCOUNTER — Ambulatory Visit (HOSPITAL_COMMUNITY): Payer: BC Managed Care – PPO | Admitting: Anesthesiology

## 2021-06-25 ENCOUNTER — Encounter (HOSPITAL_COMMUNITY): Payer: Self-pay | Admitting: Orthopedic Surgery

## 2021-06-25 DIAGNOSIS — Z880 Allergy status to penicillin: Secondary | ICD-10-CM | POA: Diagnosis not present

## 2021-06-25 DIAGNOSIS — Z881 Allergy status to other antibiotic agents status: Secondary | ICD-10-CM | POA: Insufficient documentation

## 2021-06-25 DIAGNOSIS — Z96652 Presence of left artificial knee joint: Secondary | ICD-10-CM | POA: Insufficient documentation

## 2021-06-25 DIAGNOSIS — Z9884 Bariatric surgery status: Secondary | ICD-10-CM | POA: Diagnosis not present

## 2021-06-25 DIAGNOSIS — G5622 Lesion of ulnar nerve, left upper limb: Secondary | ICD-10-CM | POA: Insufficient documentation

## 2021-06-25 DIAGNOSIS — Z888 Allergy status to other drugs, medicaments and biological substances status: Secondary | ICD-10-CM | POA: Insufficient documentation

## 2021-06-25 HISTORY — PX: ULNAR NERVE TRANSPOSITION: SHX2595

## 2021-06-25 SURGERY — ULNAR NERVE DECOMPRESSION/TRANSPOSITION
Anesthesia: Regional | Site: Elbow | Laterality: Left

## 2021-06-25 MED ORDER — PROMETHAZINE HCL 25 MG/ML IJ SOLN: 6.2500 mg | INTRAMUSCULAR | Status: DC | PRN

## 2021-06-25 MED ORDER — CLONIDINE HCL (ANALGESIA) 100 MCG/ML EP SOLN
EPIDURAL | Status: DC | PRN
  Administered 2021-06-25: 100 ug

## 2021-06-25 MED ORDER — DEXMEDETOMIDINE (PRECEDEX) IN NS 20 MCG/5ML (4 MCG/ML) IV SYRINGE
PREFILLED_SYRINGE | INTRAVENOUS | Status: DC | PRN
Start: 2021-06-25 — End: 2021-06-25
  Administered 2021-06-25: 20 ug via INTRAVENOUS

## 2021-06-25 MED ORDER — HYDROCODONE-ACETAMINOPHEN 5-325 MG PO TABS: 1.0000 | ORAL_TABLET | Freq: Four times a day (QID) | ORAL | 0 refills | Status: DC | PRN

## 2021-06-25 MED ORDER — KETAMINE HCL 50 MG/5ML IJ SOSY
PREFILLED_SYRINGE | INTRAMUSCULAR | Status: AC
  Filled 2021-06-25: qty 5

## 2021-06-25 MED ORDER — MIDAZOLAM HCL 2 MG/2ML IJ SOLN: 2.0000 mg | Freq: Once | INTRAMUSCULAR | Status: AC

## 2021-06-25 MED ORDER — SUCCINYLCHOLINE CHLORIDE 20 MG/ML IJ SOLN
INTRAMUSCULAR | Status: DC | PRN
  Administered 2021-06-25: 140 mg via INTRAVENOUS

## 2021-06-25 MED ORDER — ORAL CARE MOUTH RINSE: 15.0000 mL | Freq: Once | OROMUCOSAL | Status: AC

## 2021-06-25 MED ORDER — DEXAMETHASONE SODIUM PHOSPHATE 4 MG/ML IJ SOLN
INTRAMUSCULAR | Status: DC | PRN
  Administered 2021-06-25: 5 mg via PERINEURAL

## 2021-06-25 MED ORDER — AMISULPRIDE (ANTIEMETIC) 5 MG/2ML IV SOLN: 10.0000 mg | Freq: Once | INTRAVENOUS | Status: DC | PRN

## 2021-06-25 MED ORDER — FENTANYL CITRATE (PF) 100 MCG/2ML IJ SOLN: 100.0000 ug | Freq: Once | INTRAMUSCULAR | Status: AC

## 2021-06-25 MED ORDER — CLINDAMYCIN PHOSPHATE 900 MG/50ML IV SOLN
900.0000 mg | Freq: Once | INTRAVENOUS | Status: AC
  Administered 2021-06-25: 900 mg via INTRAVENOUS
  Filled 2021-06-25: qty 50

## 2021-06-25 MED ORDER — ROPIVACAINE HCL 7.5 MG/ML IJ SOLN
INTRAMUSCULAR | Status: DC | PRN
  Administered 2021-06-25: 30 mL via PERINEURAL

## 2021-06-25 MED ORDER — KETOROLAC TROMETHAMINE 30 MG/ML IJ SOLN: 30.0000 mg | Freq: Once | INTRAMUSCULAR | Status: DC

## 2021-06-25 MED ORDER — KETOROLAC TROMETHAMINE 15 MG/ML IJ SOLN
INTRAMUSCULAR | Status: DC | PRN
  Administered 2021-06-25: 15 mg via INTRAVENOUS

## 2021-06-25 MED ORDER — ACETAMINOPHEN 10 MG/ML IV SOLN: 1000.0000 mg | Freq: Once | INTRAVENOUS | Status: DC | PRN

## 2021-06-25 MED ORDER — FENTANYL CITRATE (PF) 100 MCG/2ML IJ SOLN
INTRAMUSCULAR | Status: AC
  Administered 2021-06-25: 100 ug via INTRAVENOUS
  Filled 2021-06-25: qty 2

## 2021-06-25 MED ORDER — MIDAZOLAM HCL 2 MG/2ML IJ SOLN
INTRAMUSCULAR | Status: AC
  Administered 2021-06-25: 2 mg via INTRAVENOUS
  Filled 2021-06-25: qty 2

## 2021-06-25 MED ORDER — OXYCODONE HCL 5 MG PO TABS: 5.0000 mg | ORAL_TABLET | Freq: Once | ORAL | Status: DC | PRN

## 2021-06-25 MED ORDER — LACTATED RINGERS IV SOLN: INTRAVENOUS | Status: DC

## 2021-06-25 MED ORDER — PROPOFOL 10 MG/ML IV BOLUS
INTRAVENOUS | Status: DC | PRN
  Administered 2021-06-25: 200 mg via INTRAVENOUS

## 2021-06-25 MED ORDER — OXYCODONE HCL 5 MG/5ML PO SOLN: 5.0000 mg | Freq: Once | ORAL | Status: DC | PRN

## 2021-06-25 MED ORDER — CHLORHEXIDINE GLUCONATE 0.12 % MT SOLN
OROMUCOSAL | Status: AC
  Administered 2021-06-25: 15 mL via OROMUCOSAL
  Filled 2021-06-25: qty 15

## 2021-06-25 MED ORDER — FENTANYL CITRATE (PF) 100 MCG/2ML IJ SOLN: 25.0000 ug | INTRAMUSCULAR | Status: DC | PRN

## 2021-06-25 MED ORDER — ONDANSETRON HCL 4 MG/2ML IJ SOLN
INTRAMUSCULAR | Status: DC | PRN
  Administered 2021-06-25: 4 mg via INTRAVENOUS

## 2021-06-25 MED ORDER — CHLORHEXIDINE GLUCONATE 0.12 % MT SOLN: 15.0000 mL | Freq: Once | OROMUCOSAL | Status: AC

## 2021-06-25 MED ORDER — DEXAMETHASONE SODIUM PHOSPHATE 10 MG/ML IJ SOLN
INTRAMUSCULAR | Status: DC | PRN
  Administered 2021-06-25: 10 mg via INTRAVENOUS

## 2021-06-25 SURGICAL SUPPLY — 52 items
BAND RUBBER #18 3X1/16 STRL (MISCELLANEOUS) IMPLANT
BLADE MINI RND TIP GREEN BEAV (BLADE) IMPLANT
BNDG COHESIVE 1X5 TAN STRL LF (GAUZE/BANDAGES/DRESSINGS) IMPLANT
BNDG COHESIVE 3X5 TAN STRL LF (GAUZE/BANDAGES/DRESSINGS) ×3 IMPLANT
BNDG ELASTIC 4X5.8 VLCR STR LF (GAUZE/BANDAGES/DRESSINGS) ×3 IMPLANT
BNDG ESMARK 4X9 LF (GAUZE/BANDAGES/DRESSINGS) ×3 IMPLANT
BNDG GAUZE ELAST 4 BULKY (GAUZE/BANDAGES/DRESSINGS) IMPLANT
CHLORAPREP W/TINT 26 (MISCELLANEOUS) ×3 IMPLANT
CORD BIPOLAR FORCEPS 12FT (ELECTRODE) ×3 IMPLANT
COVER SURGICAL LIGHT HANDLE (MISCELLANEOUS) ×3 IMPLANT
COVER WAND RF STERILE (DRAPES) IMPLANT
CUFF TOURN SGL QUICK 18X4 (TOURNIQUET CUFF) IMPLANT
CUFF TOURN SGL QUICK 24 (TOURNIQUET CUFF) ×3
CUFF TRNQT CYL 24X4X16.5-23 (TOURNIQUET CUFF) ×1 IMPLANT
DRAPE OEC MINIVIEW 54X84 (DRAPES) IMPLANT
DRSG KUZMA FLUFF (GAUZE/BANDAGES/DRESSINGS) IMPLANT
DRSG PAD ABDOMINAL 8X10 ST (GAUZE/BANDAGES/DRESSINGS) ×3 IMPLANT
DURAPREP 26ML APPLICATOR (WOUND CARE) IMPLANT
GAUZE SPONGE 2X2 8PLY STRL LF (GAUZE/BANDAGES/DRESSINGS) IMPLANT
GAUZE SPONGE 4X4 12PLY STRL (GAUZE/BANDAGES/DRESSINGS) ×3 IMPLANT
GAUZE XEROFORM 1X8 LF (GAUZE/BANDAGES/DRESSINGS) ×3 IMPLANT
GLOVE SURG ENC MOIS LTX SZ6.5 (GLOVE) ×3 IMPLANT
GLOVE SURG ENC MOIS LTX SZ7.5 (GLOVE) ×3 IMPLANT
GLOVE SURG UNDER POLY LF SZ7 (GLOVE) ×3 IMPLANT
GOWN STRL REUS W/ TWL LRG LVL3 (GOWN DISPOSABLE) ×1 IMPLANT
GOWN STRL REUS W/ TWL XL LVL3 (GOWN DISPOSABLE) ×1 IMPLANT
GOWN STRL REUS W/TWL LRG LVL3 (GOWN DISPOSABLE) ×3
GOWN STRL REUS W/TWL XL LVL3 (GOWN DISPOSABLE) ×3
KIT BASIN OR (CUSTOM PROCEDURE TRAY) ×3 IMPLANT
KIT TURNOVER KIT B (KITS) ×3 IMPLANT
MANIFOLD NEPTUNE II (INSTRUMENTS) ×3 IMPLANT
NEEDLE HYPO 25GX1X1/2 BEV (NEEDLE) IMPLANT
NS IRRIG 1000ML POUR BTL (IV SOLUTION) ×3 IMPLANT
PACK ORTHO EXTREMITY (CUSTOM PROCEDURE TRAY) ×3 IMPLANT
PAD ARMBOARD 7.5X6 YLW CONV (MISCELLANEOUS) ×6 IMPLANT
PAD CAST 4YDX4 CTTN HI CHSV (CAST SUPPLIES) IMPLANT
PADDING CAST COTTON 4X4 STRL (CAST SUPPLIES)
SLING ARM IMMOBILIZER XL (CAST SUPPLIES) ×3 IMPLANT
SPONGE GAUZE 2X2 STER 10/PKG (GAUZE/BANDAGES/DRESSINGS)
STOCKINETTE 6  STRL (DRAPES) ×3
STOCKINETTE 6 STRL (DRAPES) ×1 IMPLANT
SUT ETHILON 4 0 PS 2 18 (SUTURE) ×3 IMPLANT
SUT ETHILON 5 0 PS 2 18 (SUTURE) IMPLANT
SUT SILK 4 0 PS 2 (SUTURE) IMPLANT
SUT VIC AB 2-0 SH 27 (SUTURE) ×3
SUT VIC AB 2-0 SH 27XBRD (SUTURE) ×1 IMPLANT
SUT VICRYL 4-0 PS2 18IN ABS (SUTURE) ×3 IMPLANT
SYR CONTROL 10ML LL (SYRINGE) IMPLANT
TOWEL GREEN STERILE (TOWEL DISPOSABLE) ×3 IMPLANT
TOWEL GREEN STERILE FF (TOWEL DISPOSABLE) ×3 IMPLANT
UNDERPAD 30X36 HEAVY ABSORB (UNDERPADS AND DIAPERS) ×3 IMPLANT
WATER STERILE IRR 1000ML POUR (IV SOLUTION) IMPLANT

## 2021-06-25 NOTE — Anesthesia Procedure Notes (Addendum)
Anesthesia Regional Block: Axillary brachial plexus block   Pre-Anesthetic Checklist: , timeout performed,  Correct Patient, Correct Site, Correct Laterality,  Correct Procedure, Correct Position, site marked,  Risks and benefits discussed,  Surgical consent,  Pre-op evaluation,  At surgeon's request and post-op pain management  Laterality: Upper  Prep: chloraprep       Needles:  Injection technique: Single-shot  Needle Type: Stimulator Needle - 40     Needle Length: 4cm  Needle Gauge: 22     Additional Needles:   Procedures:,,,, ultrasound used (permanent image in chart),,    Narrative:  Start time: 06/25/2021 1:30 PM End time: 06/25/2021 1:50 PM Injection made incrementally with aspirations every 5 mL.  Performed by: Personally  Anesthesiologist: Lewie Loron, MD  Additional Notes: BP cuff, EKG monitors applied. Sedation begun. Nerve location verified with U/S. Anesthetic injected incrementally, slowly , and after neg aspirations under direct u/s guidance. Good perineural spread. Tolerated well.

## 2021-06-25 NOTE — Brief Op Note (Signed)
06/25/2021  3:52 PM  PATIENT:  Azucena Freed  60 y.o. female  PRE-OPERATIVE DIAGNOSIS:  CUBITAL TUNNEL ON LEFT  POST-OPERATIVE DIAGNOSIS:  CUBITAL TUNNEL ON LEFT  PROCEDURE:  Procedure(s) with comments: DECOMPRESSION LEFT ULNAR NERVE ELBOW (Left) - AXILLARY BLOCK  SURGEON:  Surgeon(s) and Role:    * Cindee Salt, MD - Primary    * Betha Loa, MD - Assisting  PHYSICIAN ASSISTANT:   ASSISTANTS: K Daschel Roughton,MD   ANESTHESIA:   regional and general  EBL: 22ml  BLOOD ADMINISTERED:none  DRAINS: none   LOCAL MEDICATIONS USED:  none  SPECIMEN:  No Specimen  DISPOSITION OF SPECIMEN:  N/A  COUNTS:  YES  TOURNIQUET:   Total Tourniquet Time Documented: Upper Arm (Left) - 26 minutes Total: Upper Arm (Left) - 26 minutes   DICTATION: .Reubin Milan Dictation  PLAN OF CARE: Discharge to home after PACU  PATIENT DISPOSITION:  PACU - hemodynamically stable.

## 2021-06-25 NOTE — Anesthesia Postprocedure Evaluation (Signed)
Anesthesia Post Note  Patient: Jocelyn Morris  Procedure(s) Performed: DECOMPRESSION LEFT ULNAR NERVE ELBOW (Left: Elbow)     Patient location during evaluation: PACU Anesthesia Type: General Level of consciousness: awake Pain management: pain level controlled Vital Signs Assessment: post-procedure vital signs reviewed and stable Respiratory status: spontaneous breathing, nonlabored ventilation, respiratory function stable and patient connected to nasal cannula oxygen Cardiovascular status: blood pressure returned to baseline and stable Postop Assessment: no apparent nausea or vomiting Anesthetic complications: no   No notable events documented.  Last Vitals:  Vitals:   06/25/21 1600 06/25/21 1615  BP: 100/76 111/66  Pulse: 78 71  Resp: 10 18  Temp: 36.7 C 36.7 C  SpO2: 96% 97%    Last Pain:  Vitals:   06/25/21 1615  PainSc: 0-No pain                 [name] P Tijana Walder

## 2021-06-25 NOTE — Discharge Instructions (Addendum)

## 2021-06-25 NOTE — Anesthesia Procedure Notes (Signed)
Procedure Name: Intubation Date/Time: 06/25/2021 3:30 PM Performed by: Stanton Kidney, CRNA Pre-anesthesia Checklist: Patient identified, Patient being monitored, Timeout performed, Emergency Drugs available and Suction available Patient Re-evaluated:Patient Re-evaluated prior to induction Oxygen Delivery Method: Circle system utilized Preoxygenation: Pre-oxygenation with 100% oxygen Induction Type: IV induction Ventilation: Mask ventilation without difficulty Laryngoscope Size: 3 and Glidescope Grade View: Grade I Tube type: Oral Tube size: 7.0 mm Number of attempts: 1 Airway Equipment and Method: Stylet Placement Confirmation: ETT inserted through vocal cords under direct vision, positive ETCO2 and breath sounds checked- equal and bilateral Secured at: 21 cm Tube secured with: Tape Dental Injury: Teeth and Oropharynx as per pre-operative assessment

## 2021-06-25 NOTE — Op Note (Signed)
NAME: Jocelyn Morris MEDICAL RECORD NO: [id number] DATE OF BIRTH: [birth year] FACILITY: [facility] LOCATION: [facility] OR PHYSICIAN: Nicki Reaper, MD   OPERATIVE REPORT   DATE OF PROCEDURE: 06/25/21    PREOPERATIVE DIAGNOSIS: Cubital tunnel syndrome left arm   POSTOPERATIVE DIAGNOSIS: Same   PROCEDURE: Decompression ulnar nerve left elbow   SURGEON: Cindee Salt, M.D.   ASSISTANT: Betha Loa, MD   ANESTHESIA:  General with regional   INTRAVENOUS FLUIDS:  Per anesthesia flow sheet.   ESTIMATED BLOOD LOSS:  Minimal.   COMPLICATIONS:  None.   SPECIMENS:  none   TOURNIQUET TIME:    Total Tourniquet Time Documented: Upper Arm (Left) - 26 minutes Total: Upper Arm (Left) - 26 minutes    DISPOSITION:  Stable to PACU.   INDICATIONS: Patient is a 60 year old female with history of numbness and tingling pain in her left arm nerve conductions reveal cubital tunnel syndrome bilaterally.  This not responded to conservative treatment.  She is admitted for decompression possible transposition to the ulnar nerve at the elbow.  Pre-.  Postoperative course been discussed along with risks and complications.  She is aware there is no guarantee to the surgery the possibility of infection recurrence injury to arteries nerves tendons incomplete relief of symptoms dystrophy that we are attempting to halt the process giving the nerve the opportunity to improve but cannot guarantee that.  Preoperative area the patient is seen the extremity marked by both patient and surgeon antibiotic given a supraclavicular block was carried out without difficulty under the direction anesthesia department in the preoperative area. OPERATIVE COURSE: Patient is brought to the operating room placed in the supine position with the left arm free.  Prep was done with ChloraPrep 3-minute dry time allowed timeout to confirm patient procedure.  The limb was exsanguinated with an Esmarch bandage turn placed on the upper arm was inflated  to 250 mmHg the patient complained of pain and a general anesthetic was then given by anesthesia.  An incision was then made over the medial epicondyle of the left elbow carried down through subcutaneous tissue.  Bleeders were electrocauterized with bipolar.  The dissection was carried down to the medial epicondyle.  Posterior branches of the medial antebrachial cutaneous nerve of the forearm were searched for but were not identified in the incision.  Osborne's fascia was then released on its posterior aspect revealing the ulnar nerve dissection was carried distally and between the flexor carpi ulnaris and the overlying skin and subcutaneous tissue and adipose tissue.  A stool retractor was placed allowing visualization of the flexor carpi ulnaris the superficial fascia was split the muscle then split a dissection was then carried and the deep fascia releasing this for approximately 5 to 6 cm distally.  Attention was then directed proximally.  The subcutaneous tissue skin was then dissected from the brachial fascia.  The stool retractor was placed.  This allowed visualization of the fascia and the nerve the nerve was protected and the fascia was then released again for approximately 5 to 6 cm proximally.  The elbow was then placed in a fully flexed position no subluxation transverse location of the ulnar nerve occurred.  The wound was copiously irrigated with saline.  The flap of Osborne's fascia was then sutured to the posterior subcutaneous tissue with 2-0 Vicryl sutures in subcutaneous tissue was closed in layers with interrupted 4-0 Vicryl sutures.  The skin was closed with interrupted 4-0 nylon sutures.  A sterile compressive dressing with Ace wrap  was applied.  Deflation of the tourniquet all fingers immediately pink.  She was taken to the recovery room for observation in satisfactory condition.  She will be discharged home to return to the hand center [facility] in 1 week on Norco 04/30/2024.   Cindee Salt,  MD Electronically signed, 06/25/21

## 2021-06-25 NOTE — H&P (Signed)
Jocelyn Morris is an 60 y.o. female.   Chief Complaint: numn=bness left hand HPI: Jocelyn Morris a 60 year old right-hand-dominant female referred by Dr. Lucrezia Starch for consultation regarding numbness and tingling bilateral hands. She is complaining of aching at her elbow moderate in nature especially left side. She states this began approximately 3 months ago. She states the right side began after this. They are centrally numb to the ring and small fingers on a constant basis at the present time. She has no history of injury to the hands to the elbow or to the neck. She recalls no history of injury inciting this. She has not had any treatment other than a brace which she wears at night which has not given her relief. She states left is worse than the right. She has a history of thyroid problems arthritis no history of diabetes or gout. Family history is positive diabetes thyroid problems and arthritis. She has been tested for diabetes. She has had nerve conductions done revealing cubital tunnel syndrome bilaterally with velocities dropped to 37 on the right and 33 on the left at the elbows  Past Medical History:  Diagnosis Date   Arthritis    LEFT KNEE PAIN AND OA; S/P LUMBAR FUSION -- SOME BACK STIFFNESS-ESP AFTER LYING DOWN   Cardiomegaly    Chronic diastolic heart failure (HCC)    History of kidney stones    Hyperlipidemia    Hypertension    Hypothyroidism    hypothyroid   Lymphedema    OSA (obstructive sleep apnea)    Osteoarthritis    Paresthesia    fingers   Pneumonia 09/29/2020   Pulmonary hypertension (HCC)     Past Surgical History:  Procedure Laterality Date   BACK SURGERY  12/29/1997   LUMBAR FUSION- 4 CAGES   BREAST SURGERY Left    lumpectomy-benign lump   DILATION AND CURETTAGE OF UTERUS  12/29/2009   LAP BAND SURGERY  12/30/2007   TONSILLECTOMY  12/29/1982   TOTAL KNEE ARTHROPLASTY Left 05/09/2013   Procedure: LEFT TOTAL KNEE ARTHROPLASTY;  Surgeon: Loanne Drilling,  MD;  Location: [facility];  Service: Orthopedics;  Laterality: Left;   URETEROSCOPY FOR STONE REMOVAL      Family History  Problem Relation Age of Onset   Diabetes Father    Alcoholism Father    CAD Father    Diabetes Mellitus II Mother    Stroke Mother    Rheum arthritis Maternal Grandfather    Heart attack Paternal Grandfather    Social History:  reports that she has never smoked. She has never used smokeless tobacco. She reports that she does not drink alcohol and does not use drugs.  Allergies:  Allergies  Allergen Reactions   Cymbalta [Duloxetine Hcl] Other (See Comments)    Pt states it gave her bad headache   Keflex [Cephalexin]     Upset stomach   Penicillins Swelling and Rash    Swelling to throat  .Did it involve swelling of the face/tongue/throat, SOB, or low BP? Yes Did it involve sudden or severe rash/hives, skin peeling, or any reaction on the inside of your mouth or nose? Yes Did you need to seek medical attention at a hospital or doctor's office? Yes When did it last happen?      1981 If all above answers are "NO", may proceed with cephalosporin use.     No medications prior to admission.    No results found for this or any previous visit (from the past  48 hour(s)).  No results found.   Pertinent items are noted in HPI.  Last menstrual period 03/25/2013.  General appearance: alert, cooperative, and appears stated age Head: Normocephalic, without obvious abnormality, atraumatic Neck: no JVD Resp: clear to auscultation bilaterally Cardio: regular rate and rhythm, S1, S2 normal, no murmur, click, rub or gallop GI: soft, non-tender; bowel sounds normal; no masses,  no organomegaly Extremities:  numbness left hand Pulses: 2+ and symmetric Skin: Skin color, texture, turgor normal. No rashes or lesions Neurologic: Grossly normal Incision/Wound: na  Assessment/Plan Diagnosed bilateral cubital tunnel syndrome nerve conductions positive Plan: We have  discussed the nature of the problem with her. We have discussed decompression to the ulnar nerves bilaterally. She is advised this may require transposition. She is advised there is no guarantee to the surgery the possibility of infection recurrence injury to arteries nerves tendons complete relief symptoms dystrophy. She has advised that we are attempting to halt the process giving the nerve the potential to get better but cannot guarantee that. She is vies if the nerves are transposed this will require splinting afterwards otherwise just bandages. She would like to proceed and is scheduled for release ulnar nerve at the elbow with possible transposition left side as an outpatient under regional anesthesia. Cindee Salt 06/25/2021, 11:44 AM

## 2021-06-25 NOTE — Op Note (Signed)
I assisted Surgeon(s) and Role:    * Cindee Salt, MD - Primary    * Betha Loa, MD - Assisting on the Procedure(s): DECOMPRESSION LEFT ULNAR NERVE ELBOW on 06/25/2021.  I provided assistance on this case as follows: positioning of arm, retraction soft tissues.  Electronically signed by: Betha Loa, MD Date: 06/25/2021 Time: 3:51 PM

## 2021-06-25 NOTE — Transfer of Care (Signed)
Immediate Anesthesia Transfer of Care Note  Patient: Jocelyn Morris  Procedure(s) Performed: DECOMPRESSION LEFT ULNAR NERVE ELBOW (Left: Elbow)  Patient Location: PACU  Anesthesia Type:General  Level of Consciousness: awake, alert  and oriented  Airway & Oxygen Therapy: Patient Spontanous Breathing and Patient connected to face mask oxygen  Post-op Assessment: Report given to RN and Post -op Vital signs reviewed and stable  Post vital signs: Reviewed and stable  Last Vitals:  Vitals Value Taken Time  BP 100/76 06/25/21 1603  Temp 36.7 C 06/25/21 1600  Pulse 76 06/25/21 1609  Resp 14 06/25/21 1609  SpO2 99 % 06/25/21 1609  Vitals shown include unvalidated device data.  Last Pain:  Vitals:   06/25/21 1600  PainSc: Asleep         Complications: No notable events documented.

## 2021-06-26 ENCOUNTER — Encounter (HOSPITAL_COMMUNITY): Payer: Self-pay | Admitting: Orthopedic Surgery

## 2021-07-08 ENCOUNTER — Ambulatory Visit: Payer: BC Managed Care – PPO | Admitting: Diagnostic Neuroimaging

## 2021-07-26 ENCOUNTER — Telehealth: Payer: Self-pay | Admitting: Pulmonary Disease

## 2021-07-26 NOTE — Telephone Encounter (Signed)
From OV 06/03/21 Subjective:    She is here with her husband.   Weight up 19 lbs since visit in March.  She had previous lap band that had to be revised.  She has been trying weight watchers.   She uses Bipap nightly.  Gets mask leak and has to tighten her mask.  Otherwise working well.  Using 2 liters 24/7.  Still waiting for POC.  Called [facility] and spoke with Orpah Clinton letting him know that pt is currently on O2 and has been waiting a POC. Orpah Clinton requested to have last OV notes that states that pt is on O2 faxed to him at [phone]. I have faxed pt's OV notes. Nothing further needed.

## 2021-07-29 ENCOUNTER — Telehealth: Payer: Self-pay | Admitting: Pulmonary Disease

## 2021-07-29 DIAGNOSIS — G4733 Obstructive sleep apnea (adult) (pediatric): Secondary | ICD-10-CM

## 2021-07-29 NOTE — Telephone Encounter (Signed)
Spoke with the pt  She states she has been using her 1lpm o2 24/7, including with her BIPAP at night  She has a portable o2 system that is only pulsed flow  She wants to know if when she travels, if she would be okay using BIPAP alone so she does not have to bring so much equipment Please advise thanks

## 2021-07-30 NOTE — Telephone Encounter (Signed)
Called spoke with patient.  Let her know the order has been placed.  Verified the DME company used for oxygen.  Let her know the DME company sets up the test and should contact her.   Patient voiced understanding Nothing further needed at this time.

## 2021-07-30 NOTE — Telephone Encounter (Signed)
Please arrange for overnight oximetry with Bipap.  She should not use supplemental oxygen while wearing Bipap during the night of the overnight oximetry.  Based on this will determine if she still needs supplemental oxygen at night or if Bipap is sufficient by itself.

## 2021-08-06 ENCOUNTER — Telehealth: Payer: Self-pay | Admitting: Pulmonary Disease

## 2021-08-06 NOTE — Telephone Encounter (Signed)
Message sent to [employer] 

## 2021-08-06 NOTE — Telephone Encounter (Signed)
Received message from [employer] pt is scheduled and aware

## 2021-08-06 NOTE — Telephone Encounter (Signed)
Order in Epic for ONO, pt not contacted yet so forwarding this to [facility] pool per protocol. Thanks.

## 2021-08-07 ENCOUNTER — Telehealth: Payer: Self-pay | Admitting: Cardiology

## 2021-08-07 NOTE — Telephone Encounter (Signed)
   [facility] Pre-operative Risk Assessment    Patient Name: Jocelyn Morris  DOB: [birth year] MRN: [id number]  HEARTCARE STAFF:  - [employer]!!!!!! Under Visit Info/Reason for Call, type in Other and utilize the format Clearance MM/DD/YY or Clearance TBD. Do not use dashes or single digits. - Please review there is not already an duplicate clearance open for this procedure. - If request is for dental extraction, please clarify the # of teeth to be extracted. - If the patient is currently at the dentist's office, call Pre-Op Callback Staff (MA/nurse) to input urgent request.  - If the patient is not currently in the dentist office, please route to the Pre-Op pool.  Request for surgical clearance:  What type of surgery is being performed? Decompression possible transposition  When is this surgery scheduled? TBD  What type of clearance is required (medical clearance vs. Pharmacy clearance to hold med vs. Both)? BOTH  Are there any medications that need to be held prior to surgery and how long? Please provide instruction for any anticoagulation therapy Benedict Needy)   Practice name and name of physician performing surgery? [facility]   What is the office phone number? [phone]   7.   What is the office fax number? [phone]  8.   Anesthesia type (None, local, MAC, general) ? CHOICE    Chanda Busing 08/07/2021, 4:32 PM  _________________________________________________________________   (provider comments below)

## 2021-08-08 ENCOUNTER — Other Ambulatory Visit: Payer: Self-pay | Admitting: Orthopedic Surgery

## 2021-08-08 NOTE — Telephone Encounter (Signed)
   Name: Jocelyn Morris  DOB: July 23, 1961  MRN: [id number]   Primary Cardiologist: [name], MD  Chart reviewed as part of pre-operative protocol coverage. Patient was contacted 08/08/2021 in reference to pre-operative risk assessment for pending surgery as outlined below.  Jocelyn Morris was last seen on 04/2021 by Dr. Antoine Poche with pertinent histroy of pulm HTN, diastolic CHF, respiratory failure, DVT, OSA, lymphedema, hypothyroidism, aortic sclerosis, obesity hypoventilation syndrome. I reached out to patient for update on how she is doing. The patient affirms she has been doing well without any new cardiac symptoms.  Therefore, based on ACC/AHA guidelines, the patient would be at acceptable risk for the planned procedure without further cardiovascular testing.   At this time she is on ASA only, not Xarelto (Xarelto stopped earlier this year). I reviewed her previous clearance and agree with the recommendation outlined: "Given her prior history of DVT, ideally, she should remain on aspirin without interruption throughout the perioperative period.  However, if the bleeding risk is too great, aspirin can be held up to 7 days prior to surgery and resumed postop as soon as it is felt to be safe."  The patient was advised that if she develops new symptoms prior to surgery to contact our office to arrange for a follow-up visit, and she verbalized understanding.  I will route this recommendation to the requesting party via Epic fax function and remove from pre-op pool. Please call with questions.  Laurann Montana, PA-C 08/08/2021, 11:53 AM

## 2021-08-16 ENCOUNTER — Telehealth: Payer: Self-pay | Admitting: Pulmonary Disease

## 2021-08-16 NOTE — Telephone Encounter (Signed)
ONO 08/08/21 >> test time 7 hrs 10 min.  Baseline SpO2 87%, low SpO2 69%.  Spent 5 hrs 7 min with SpO2 < 88%.   Test was ordered to be done with Bipap, but report states it was done on room air.  Can you please check with the patient if she did ONO with Bipap or room air.  Please also let [employer] know of this issue.

## 2021-08-19 NOTE — Telephone Encounter (Signed)
Spoke with patient to let her know recommendations from Dr. Craige Cotta. She expressed understanding. Patient wants to know if there is any recommendations as far as oxygen to use with her Bipap for her to have when she travels. She states she called [employer] and they told her they don't have any options for her. I have called [employer] and left a message for [name]. Will wait to hear back.

## 2021-08-19 NOTE — Telephone Encounter (Signed)
Called and spoke with patient to ask her about ONO. She states that she did the ONO with her Bipap on but did not have her oxygen hooked up to it. Will send to Dr. Craige Cotta to let him know and get further recommendations.

## 2021-08-19 NOTE — Telephone Encounter (Signed)
Okay.  She needs to continue using supplemental oxygen at night with Bipap.

## 2021-08-20 NOTE — Telephone Encounter (Signed)
Messages from [name] from [employer]. She will reach out to patient. Nothing further needed at this time.  Jocelyn Morris, [name], [place]; [name] I can reach out to her!        Previous Messages   ----- Message -----  From: Clarisse Gouge, CMA  Sent: 08/20/2021   4:23 PM EDT  To: [name]  Subject: RE: Updates                                     That would be fantastic and I know she would appreciate it because she really wants to travel but feels like she can't. That is also the recommendation from the provider as well.   She should be able to get set up with a portable oxygen concentrator that she can use to travel with also.  Can either send an order to [employer] to arrange for a POC at 2 liters, or she could purchase a POC through [employer].     Just let me know and I can either call the patient or y'all can reach out to her and I will document in her chart.  ----- Message -----  From: [name]  Sent: 08/20/2021   4:09 PM EDT  To: Gardiner Fanti, CMA  Subject: RE: Updates                                     We may be able to do a POC for her.

## 2021-08-20 NOTE — Telephone Encounter (Signed)
She should be able to get set up with a portable oxygen concentrator that she can use to travel with also.  Can either send an order to [employer] to arrange for a POC at 2 liters, or she could purchase a POC through [employer].

## 2021-08-20 NOTE — Telephone Encounter (Signed)
Community message send to [place] asking for any updates. I am also going to route this to Dr. Craige Cotta just to see if he knows of any recommendations for patient.  Dr. Craige Cotta please advise if there is any recommendations as far as oxygen to use with her Bipap for her to have when she travels.

## 2021-09-04 NOTE — Progress Notes (Signed)
Surgical Instructions    Your procedure is scheduled on 09/19/21.  Report to [facility] Main Entrance "A" at 06:30 A.M., then check in with the Admitting office.  Call this number if you have problems the morning of surgery:  [phone]   If you have any questions prior to your surgery date call [phone]: Open Monday-Friday 8am-4pm    Remember:  Do not eat after midnight the night before your surgery  You may drink clear liquids until 05:30am the morning of your surgery.   Clear liquids allowed are: Water, Non-Citrus Juices (without pulp), Carbonated Beverages, Clear Tea, Black Coffee ONLY (NO MILK, CREAM OR POWDERED CREAMER of any kind), and Gatorade  Patient Instructions  The night before surgery:  No food after midnight. ONLY clear liquids after midnight  The day of surgery (if you do NOT have diabetes):  Drink ONE (1) Pre-Surgery Clear Ensure by 05:30am the morning of surgery. Drink in one sitting. Do not sip.  This drink was given to you during your hospital  pre-op appointment visit. Nothing else to drink after completing the  Pre-Surgery Clear Ensure.           If you have questions, please contact your surgeon's office.     Take these medicines the morning of surgery with A SIP OF WATER  levothyroxine (SYNTHROID)   IF NEEDED: acetaminophen (TYLENOL)   As of today, STOP taking any Aspirin (unless otherwise instructed by your surgeon) Aleve, Naproxen, Ibuprofen, Motrin, Advil, Goody's, BC's, all herbal medications, fish oil, and all vitamins.          Do not wear jewelry or makeup Do not wear lotions, powders, perfumes/colognes, or deodorant. Do not shave 48 hours prior to surgery.   Do not bring valuables to the hospital. DO Not wear nail polish, gel polish, artificial nails, or any other type of covering on natural nails including finger and toenails. If patients have artificial nails, gel coating, etc. that need to be removed by a nail salon please have  this removed prior to surgery or surgery may need to be canceled/delayed if the surgeon/ anesthesia feels like the patient is unable to be adequately monitored.             [facility] is not responsible for any belongings or valuables.  Do NOT Smoke (Tobacco/Vaping)  24 hours prior to your procedure If you use a CPAP at night, you may bring all equipment for your overnight stay.   Contacts, glasses, dentures or bridgework may not be worn into surgery, please bring cases for these belongings   For patients admitted to the hospital, discharge time will be determined by your treatment team.   Patients discharged the day of surgery will not be allowed to drive home, and someone needs to stay with them for 24 hours.  ONLY 1 SUPPORT PERSON MAY BE PRESENT WHILE YOU ARE IN SURGERY. IF YOU ARE TO BE ADMITTED ONCE YOU ARE IN YOUR ROOM YOU WILL BE ALLOWED TWO (2) VISITORS.  Minor children may have two parents present. Special consideration for safety and communication needs will be reviewed on a case by case basis.  Special instructions:    Oral Hygiene is also important to reduce your risk of infection.  Remember - BRUSH YOUR TEETH THE MORNING OF SURGERY WITH YOUR REGULAR TOOTHPASTE   [place]- Preparing For Surgery  Before surgery, you can play an important role. Because skin is not sterile, your skin needs to be as free of  germs as possible. You can reduce the number of germs on your skin by washing with CHG (chlorahexidine gluconate) Soap before surgery.  CHG is an antiseptic cleaner which kills germs and bonds with the skin to continue killing germs even after washing.     Please do not use if you have an allergy to CHG or antibacterial soaps. If your skin becomes reddened/irritated stop using the CHG.  Do not shave (including legs and underarms) for at least 48 hours prior to first CHG shower. It is OK to shave your face.  Please follow these instructions carefully.     Shower the NIGHT  BEFORE SURGERY and the MORNING OF SURGERY with CHG Soap.   If you chose to wash your hair, wash your hair first as usual with your normal shampoo. After you shampoo, rinse your hair and body thoroughly to remove the shampoo.  Then [employer] and genitals (private parts) with your normal soap and rinse thoroughly to remove soap.  After that Use CHG Soap as you would any other liquid soap. You can apply CHG directly to the skin and wash gently with a scrungie or a clean washcloth.   Apply the CHG Soap to your body ONLY FROM THE NECK DOWN.  Do not use on open wounds or open sores. Avoid contact with your eyes, ears, mouth and genitals (private parts). Wash Face and genitals (private parts)  with your normal soap.   Wash thoroughly, paying special attention to the area where your surgery will be performed.  Thoroughly rinse your body with warm water from the neck down.  DO NOT shower/wash with your normal soap after using and rinsing off the CHG Soap.  Pat yourself dry with a CLEAN TOWEL.  Wear CLEAN PAJAMAS to bed the night before surgery  Place CLEAN SHEETS on your bed the night before your surgery  DO NOT SLEEP WITH PETS.   Day of Surgery: Take a shower with CHG soap. Wear Clean/Comfortable clothing the morning of surgery Do not apply any deodorants/lotions.   Remember to brush your teeth WITH YOUR REGULAR TOOTHPASTE.   Please read over the following fact sheets that you were given.

## 2021-09-05 ENCOUNTER — Encounter (HOSPITAL_COMMUNITY)
Admission: RE | Admit: 2021-09-05 | Discharge: 2021-09-05 | Disposition: A | Discharge status: Home / Self Care | Payer: BC Managed Care – PPO | Source: Ambulatory Visit | Attending: Orthopedic Surgery | Admitting: Orthopedic Surgery

## 2021-09-05 ENCOUNTER — Other Ambulatory Visit: Payer: Self-pay

## 2021-09-05 ENCOUNTER — Encounter (HOSPITAL_COMMUNITY): Payer: Self-pay

## 2021-09-05 DIAGNOSIS — Z01812 Encounter for preprocedural laboratory examination: Secondary | ICD-10-CM | POA: Insufficient documentation

## 2021-09-05 LAB — CBC
HCT: 42.2 % (ref 36.0–46.0)
Hemoglobin: 13.6 g/dL (ref 12.0–15.0)
MCH: 31 pg (ref 26.0–34.0)
MCHC: 32.2 g/dL (ref 30.0–36.0)
MCV: 96.1 fL (ref 80.0–100.0)
Platelets: 350 10*3/uL (ref 150–400)
RBC: 4.39 MIL/uL (ref 3.87–5.11)
RDW: 11.8 % (ref 11.5–15.5)
WBC: 7.2 10*3/uL (ref 4.0–10.5)
nRBC: 0 % (ref 0.0–0.2)

## 2021-09-05 LAB — BASIC METABOLIC PANEL
Anion gap: 8 (ref 5–15)
BUN: 20 mg/dL (ref 6–20)
CO2: 34 mmol/L — ABNORMAL HIGH (ref 22–32)
Calcium: 9.2 mg/dL (ref 8.9–10.3)
Chloride: 97 mmol/L — ABNORMAL LOW (ref 98–111)
Creatinine, Ser: 0.87 mg/dL (ref 0.44–1.00)
GFR, Estimated: >60 mL/min (ref >60)
Glucose, Bld: 112 mg/dL — ABNORMAL HIGH (ref 70–99)
Potassium: 3.7 mmol/L (ref 3.5–5.1)
Sodium: 139 mmol/L (ref 135–145)

## 2021-09-05 NOTE — Progress Notes (Signed)
PCP - Quintin Alto, MD Cardiologist - [name], MD Pulmonologist- Coralyn Helling, MD  PPM/ICD - n/a  Chest x-ray - n/a EKG - 11/30/20 Stress Test - pt denies ECHO - 05/01/21 Cardiac Cath - pt denies  Sleep Study - 12/06/20; wears BiPAP at night  Fasting Blood Sugar - n/a  Aspirin Instructions: Follow your surgeon's instructions on when to stop Aspirin.  If no instructions were given by your surgeon then you will need to call the office to get those instructions.     ERAS Protcol - yes PRE-SURGERY Ensure or G2- ensure  COVID TEST- n/a; ambulatory surgery  Anesthesia review: yes, cardiac hx, wears 1L of continuous O2; Rica Mast, NP notified during PAT appointment. Pt in NAD today, and has not had any recent changes in O2 needs. Oxygen saturation 98% at PAT appointment.  Patient denies shortness of breath, fever, cough and chest pain at PAT appointment   All instructions explained to the patient, with a verbal understanding of the material. Patient agrees to go over the instructions while at home for a better understanding. Patient also instructed to self quarantine after being tested for COVID-19. The opportunity to ask questions was provided.

## 2021-09-05 NOTE — Pre-Procedure Instructions (Signed)
Surgical Instructions    Your procedure is scheduled on 09/19/21.  Report to [facility] Main Entrance "A" at 06:30 A.M., then check in with the Admitting office.  Call this number if you have problems the morning of surgery:  [phone]   If you have any questions prior to your surgery date call [phone]: Open Monday-Friday 8am-4pm    Remember:  Do not eat after midnight the night before your surgery  You may drink clear liquids until 05:30am the morning of your surgery.   Clear liquids allowed are: Water, Non-Citrus Juices (without pulp), Carbonated Beverages, Clear Tea, Black Coffee ONLY (NO MILK, CREAM OR POWDERED CREAMER of any kind), and Gatorade  Patient Instructions  The night before surgery:  No food after midnight. ONLY clear liquids after midnight  The day of surgery (if you do NOT have diabetes):  Drink ONE (1) Pre-Surgery Clear Ensure by 05:30am the morning of surgery. Drink in one sitting. Do not sip.  This drink was given to you during your hospital  pre-op appointment visit. Nothing else to drink after completing the  Pre-Surgery Clear Ensure.           If you have questions, please contact your surgeon's office.     Take these medicines the morning of surgery with A SIP OF WATER  levothyroxine (SYNTHROID)   IF NEEDED: acetaminophen (TYLENOL)   Follow your surgeon's instructions on when to stop Aspirin.  If no instructions were given by your surgeon then you will need to call the office to get those instructions.     As of 09/12/21, STOP taking any Aleve, Naproxen, Ibuprofen, Motrin, Advil, Goody's, BC's, all herbal medications, fish oil, and all vitamins.          Do not wear jewelry or makeup Do not wear lotions, powders, perfumes/colognes, or deodorant. Do not shave 48 hours prior to surgery.   Do not bring valuables to the hospital. DO Not wear nail polish, gel polish, artificial nails, or any other type of covering on natural nails including  finger and toenails. If patients have artificial nails, gel coating, etc. that need to be removed by a nail salon please have this removed prior to surgery or surgery may need to be canceled/delayed if the surgeon/ anesthesia feels like the patient is unable to be adequately monitored.             [place] is not responsible for any belongings or valuables.  Do NOT Smoke (Tobacco/Vaping)  24 hours prior to your procedure If you use a CPAP at night, you may bring all equipment for your overnight stay.   Contacts, glasses, dentures or bridgework may not be worn into surgery, please bring cases for these belongings   For patients admitted to the hospital, discharge time will be determined by your treatment team.   Patients discharged the day of surgery will not be allowed to drive home, and someone needs to stay with them for 24 hours.  ONLY 1 SUPPORT PERSON MAY BE PRESENT WHILE YOU ARE IN SURGERY. IF YOU ARE TO BE ADMITTED ONCE YOU ARE IN YOUR ROOM YOU WILL BE ALLOWED TWO (2) VISITORS.  Minor children may have two parents present. Special consideration for safety and communication needs will be reviewed on a case by case basis.  Special instructions:    Oral Hygiene is also important to reduce your risk of infection.  Remember - BRUSH YOUR TEETH THE MORNING OF SURGERY WITH YOUR REGULAR TOOTHPASTE  [place]- Preparing For Surgery  Before surgery, you can play an important role. Because skin is not sterile, your skin needs to be as free of germs as possible. You can reduce the number of germs on your skin by washing with CHG (chlorahexidine gluconate) Soap before surgery.  CHG is an antiseptic cleaner which kills germs and bonds with the skin to continue killing germs even after washing.     Please do not use if you have an allergy to CHG or antibacterial soaps. If your skin becomes reddened/irritated stop using the CHG.  Do not shave (including legs and underarms) for at least 48 hours  prior to first CHG shower. It is OK to shave your face.  Please follow these instructions carefully.     Shower the NIGHT BEFORE SURGERY and the MORNING OF SURGERY with CHG Soap.   If you chose to wash your hair, wash your hair first as usual with your normal shampoo. After you shampoo, rinse your hair and body thoroughly to remove the shampoo.  Then [employer] and genitals (private parts) with your normal soap and rinse thoroughly to remove soap.  After that Use CHG Soap as you would any other liquid soap. You can apply CHG directly to the skin and wash gently with a scrungie or a clean washcloth.   Apply the CHG Soap to your body ONLY FROM THE NECK DOWN.  Do not use on open wounds or open sores. Avoid contact with your eyes, ears, mouth and genitals (private parts). Wash Face and genitals (private parts)  with your normal soap.   Wash thoroughly, paying special attention to the area where your surgery will be performed.  Thoroughly rinse your body with warm water from the neck down.  DO NOT shower/wash with your normal soap after using and rinsing off the CHG Soap.  Pat yourself dry with a CLEAN TOWEL.  Wear CLEAN PAJAMAS to bed the night before surgery  Place CLEAN SHEETS on your bed the night before your surgery  DO NOT SLEEP WITH PETS.   Day of Surgery: Take a shower with CHG soap. Wear Clean/Comfortable clothing the morning of surgery Do not apply any deodorants/lotions.   Remember to brush your teeth WITH YOUR REGULAR TOOTHPASTE.   Please read over the following fact sheets that you were given.

## 2021-09-06 NOTE — Anesthesia Preprocedure Evaluation (Addendum)
Anesthesia Evaluation  Patient identified by MRN, date of birth, ID band Patient awake    Reviewed: Allergy & Precautions, H&P , NPO status , Patient's Chart, lab work & pertinent test results  Airway Mallampati: II   Neck ROM: full    Dental   Pulmonary sleep apnea and Oxygen sleep apnea ,    breath sounds clear to auscultation       Cardiovascular hypertension, +CHF   Rhythm:regular Rate:Normal     Neuro/Psych    GI/Hepatic   Endo/Other  Hypothyroidism Morbid obesity  Renal/GU      Musculoskeletal  (+) Arthritis ,   Abdominal   Peds  Hematology   Anesthesia Other Findings   Reproductive/Obstetrics                                                             Anesthesia Evaluation  Patient identified by MRN, date of birth, ID band Patient awake    Reviewed: Allergy & Precautions, NPO status , Patient's Chart, lab work & pertinent test results  Airway Mallampati: II  TM Distance: >3 FB Neck ROM: Full    Dental  (+) Dental Advisory Given, Teeth Intact   Pulmonary sleep apnea , pneumonia,    breath sounds clear to auscultation + decreased breath sounds      Cardiovascular hypertension, Pt. on medications pulmonary hypertensionNormal cardiovascular exam Rhythm:Regular Rate:Normal  Echo 04/2021 1. Left ventricular ejection fraction, by estimation, is 60 to 65%. The left ventricle has normal function. The left ventricle has no regional wall motion abnormalities. There is mild left ventricular hypertrophy. Left ventricular diastolic parameters were normal. Normal global longitudinal strain of -20.1%.  2. Right ventricular systolic function is normal. The right ventricular size is normal. There is normal pulmonary artery systolic pressure. The estimated right ventricular systolic pressure is 15.2 mmHg.  3. Left atrial size was mildly dilated.  4. The mitral valve is grossly  normal. Trivial mitral valve  regurgitation.  5. The aortic valve is tricuspid. Aortic valve regurgitation is not visualized. Mild aortic valve sclerosis is present, with no evidence of aortic valve stenosis.  6. The inferior vena cava is normal in size with greater than 50% respiratory variability, suggesting right atrial pressure of 3 mmHg.    Neuro/Psych negative neurological ROS     GI/Hepatic negative GI ROS, Neg liver ROS,   Endo/Other  Hypothyroidism Morbid obesity  Renal/GU Renal disease     Musculoskeletal  (+) Arthritis ,   Abdominal (+) + obese,   Peds  Hematology  (+) Blood dyscrasia, anemia ,   Anesthesia Other Findings   Reproductive/Obstetrics                          Anesthesia Physical Anesthesia Plan  ASA: 4  Anesthesia Plan: Regional   Post-op Pain Management:    Induction:   PONV Risk Score and Plan: 2 and Propofol infusion, Treatment may vary due to age or medical condition and Ondansetron  Airway Management Planned: Natural Airway  Additional Equipment: None  Intra-op Plan:   Post-operative Plan:   Informed Consent: I have reviewed the patients History and Physical, chart, labs and discussed the procedure including the risks, benefits and alternatives for the proposed anesthesia with the patient or authorized  representative who has indicated his/her understanding and acceptance.     Dental advisory given  Plan Discussed with: CRNA  Anesthesia Plan Comments: (PAT note by Antionette Poles, PA-C: Follows with pulmonologist Dr. Craige Cotta for history of OSA on BiPAP, obesity hypoventilation syndrome, chronic hypoxic/hypercapnic respiratory failure on 2 L continuous oxygen.  Last seen 06/03/2021.  Upcoming surgery was discussed.  Per note, "she has decompression scheduled with Dr. Merlyn Lot later this week.  Procedure to be done with local anesthesia.  No pulmonary contraindications for her to proceed with surgery."  Cardiac history  as well as cardiac clearance addressed in telephone encounter by Tereso Newcomer, PA-C on 05/21/2021: 60 y.o.femalewith 1. Pulmonary hypertension 2. HFpEF 3. Obesity hypoventilation syndrome 4. DVT  o Anticoagulation DC'd in 5/2after6 months of therapy 5. OSA on BiPAP 6. Hypertension 7. Hyperlipidemia 8. Obesity s/p lap band 9. Echocardiogram 05/01/2021: Normal EF, normal PASP, no significant valve disease  Last OV:5/11/2022with Dr. Antoine Poche Procedure:Decompression and possible transposition of left ulnar nerve UE:AVWU aspirin  RCRI:Perioperative Risk of Major Cardiac Event is (%): 0.9(lowrisk) DASI:Functional Capacity in METs is: 4.31(functional status is good)  Patient was contacted5/24/2022in reference to pre-operative risk assessment for pending surgery as outlined below.   Since last seen,Shelbee C Joycehas done well without significant changes in her breathing or chest discomfort.  Recommendations: . Based on ACC/AHA guidelines, the patient is at acceptable risk for the planned procedure and may proceed without further cardiovascular testing. Marland Kitchen She will finish taking her rivaroxaban (Xarelto) in the next 4 days. She will then transition to aspirin. Given her prior history of DVT, ideally, she should remain on aspirin without interruption throughout the perioperative period. However, if the bleeding risk is too great, aspirin canbe held up to 7 days prior to surgery and resumed postop as soon as it is felt to be safe.   History of gastric banding, s/p removal on 02/18/2019.  Preop labs reviewed, unremarkable.  EKG 11/30/2020: Sinus rhythm with fusion complexes.  Rate 76.  CHEST - 2 VIEW 01/01/21: COMPARISON: October 07, 2020.  FINDINGS: The heart size and mediastinal contours are within normal limits. Both lungs are clear. No pneumothorax or pleural effusion is noted. The visualized skeletal structures are unremarkable.  IMPRESSION: No active  cardiopulmonary disease.  TTE 05/01/2021: 1. Left ventricular ejection fraction, by estimation, is 60 to 65%. The  left ventricle has normal function. The left ventricle has no regional  wall motion abnormalities. There is mild left ventricular hypertrophy.  Left ventricular diastolic parameters  were normal. Normal global longitudinal strain of -20.1%.  2. Right ventricular systolic function is normal. The right ventricular  size is normal. There is normal pulmonary artery systolic pressure. The  estimated right ventricular systolic pressure is 15.2 mmHg.  3. Left atrial size was mildly dilated.  4. The mitral valve is grossly normal. Trivial mitral valve  regurgitation.  5. The aortic valve is tricuspid. Aortic valve regurgitation is not  visualized. Mild aortic valve sclerosis is present, with no evidence of  aortic valve stenosis.  6. The inferior vena cava is normal in size with greater than 50%  respiratory variability, suggesting right atrial pressure of 3 mmHg.   Comparison(s): Echocardiogram done 10/08/20 showed an EF of 65-70%.   )      Anesthesia Quick Evaluation  Anesthesia Evaluation  Patient identified by MRN, date of birth, ID band Patient awake    Reviewed: Allergy & Precautions, NPO status , Patient's Chart, lab work & pertinent test results  Airway Mallampati: II  TM Distance: >3 FB Neck ROM: Full    Dental  (+) Dental Advisory Given, Teeth Intact   Pulmonary sleep apnea , pneumonia,    breath sounds clear to auscultation + decreased breath sounds      Cardiovascular hypertension, Pt. on medications pulmonary hypertensionNormal cardiovascular exam Rhythm:Regular Rate:Normal  Echo 04/2021 1. Left ventricular ejection fraction, by estimation, is 60 to 65%. The left ventricle has normal function. The left ventricle has no regional wall motion abnormalities. There is mild left ventricular hypertrophy.  Left ventricular diastolic parameters were normal. Normal global longitudinal strain of -20.1%.  2. Right ventricular systolic function is normal. The right ventricular size is normal. There is normal pulmonary artery systolic pressure. The estimated right ventricular systolic pressure is 15.2 mmHg.  3. Left atrial size was mildly dilated.  4. The mitral valve is grossly normal. Trivial mitral valve  regurgitation.  5. The aortic valve is tricuspid. Aortic valve regurgitation is not visualized. Mild aortic valve sclerosis is present, with no evidence of aortic valve stenosis.  6. The inferior vena cava is normal in size with greater than 50% respiratory variability, suggesting right atrial pressure of 3 mmHg.    Neuro/Psych negative neurological ROS     GI/Hepatic negative GI ROS, Neg liver ROS,   Endo/Other  Hypothyroidism Morbid obesity  Renal/GU Renal disease     Musculoskeletal  (+) Arthritis ,   Abdominal (+) + obese,   Peds  Hematology  (+) Blood dyscrasia, anemia ,   Anesthesia Other Findings   Reproductive/Obstetrics                          Anesthesia Physical Anesthesia Plan  ASA: 4  Anesthesia Plan: Regional   Post-op Pain Management:    Induction:   PONV Risk Score and Plan: 2 and Propofol infusion, Treatment may vary due to age or medical condition and Ondansetron  Airway Management Planned: Natural Airway  Additional Equipment: None  Intra-op Plan:   Post-operative Plan:   Informed Consent: I have reviewed the patients History and Physical, chart, labs and discussed the procedure including the risks, benefits and alternatives for the proposed anesthesia with the patient or authorized representative who has indicated his/her understanding and acceptance.     Dental advisory given  Plan Discussed with: CRNA  Anesthesia Plan Comments: (PAT note by Antionette Poles, PA-C: Follows with pulmonologist Dr. Craige Cotta for history of  OSA on BiPAP, obesity hypoventilation syndrome, chronic hypoxic/hypercapnic respiratory failure on 2 L continuous oxygen.  Last seen 06/03/2021.  Upcoming surgery was discussed.  Per note, "she has decompression scheduled with Dr. Merlyn Lot later this week.  Procedure to be done with local anesthesia.  No pulmonary contraindications for her to proceed with surgery."  Cardiac history as well as cardiac clearance addressed in telephone encounter by Tereso Newcomer, PA-C on 05/21/2021: 60 y.o.femalewith 1. Pulmonary hypertension 2. HFpEF 3. Obesity hypoventilation syndrome 4. DVT  o Anticoagulation DC'd in 5/2after6 months of therapy 5. OSA on BiPAP 6. Hypertension 7. Hyperlipidemia 8. Obesity s/p lap band 9. Echocardiogram 05/01/2021: Normal EF, normal PASP, no significant valve disease  Last OV:5/11/2022with Dr. Antoine Poche Procedure:Decompression and possible transposition of left ulnar nerve PY:KDXI aspirin  RCRI:Perioperative Risk of Major Cardiac Event is (%):  0.9(lowrisk) DASI:Functional Capacity in METs is: 4.31(functional status is good)  Patient was contacted5/24/2022in reference to pre-operative risk assessment for pending surgery as outlined below.   Since last seen,Teretha C Joycehas done well without significant changes in her breathing or chest discomfort.  Recommendations: . Based on ACC/AHA guidelines, the patient is at acceptable risk for the planned procedure and may proceed without further cardiovascular testing. Marland Kitchen. She will finish taking her rivaroxaban (Xarelto) in the next 4 days. She will then transition to aspirin. Given her prior history of DVT, ideally, she should remain on aspirin without interruption throughout the perioperative period. However, if the bleeding risk is too great, aspirin canbe held up to 7 days prior to surgery and resumed postop as soon as it is felt to be safe.   History of gastric banding, s/p removal on 02/18/2019.  Preop  labs reviewed, unremarkable.  EKG 11/30/2020: Sinus rhythm with fusion complexes.  Rate 76.  CHEST - 2 VIEW 01/01/21: COMPARISON: October 07, 2020.  FINDINGS: The heart size and mediastinal contours are within normal limits. Both lungs are clear. No pneumothorax or pleural effusion is noted. The visualized skeletal structures are unremarkable.  IMPRESSION: No active cardiopulmonary disease.  TTE 05/01/2021: 1. Left ventricular ejection fraction, by estimation, is 60 to 65%. The  left ventricle has normal function. The left ventricle has no regional  wall motion abnormalities. There is mild left ventricular hypertrophy.  Left ventricular diastolic parameters  were normal. Normal global longitudinal strain of -20.1%.  2. Right ventricular systolic function is normal. The right ventricular  size is normal. There is normal pulmonary artery systolic pressure. The  estimated right ventricular systolic pressure is 15.2 mmHg.  3. Left atrial size was mildly dilated.  4. The mitral valve is grossly normal. Trivial mitral valve  regurgitation.  5. The aortic valve is tricuspid. Aortic valve regurgitation is not  visualized. Mild aortic valve sclerosis is present, with no evidence of  aortic valve stenosis.  6. The inferior vena cava is normal in size with greater than 50%  respiratory variability, suggesting right atrial pressure of 3 mmHg.   Comparison(s): Echocardiogram done 10/08/20 showed an EF of 65-70%.   )      Anesthesia Quick Evaluation  Anesthesia Physical Anesthesia Plan  ASA: 3  Anesthesia Plan: General   Post-op Pain Management:    Induction: Intravenous  PONV Risk Score and Plan: 3 and Ondansetron, Dexamethasone, Treatment may vary due to age or medical condition and Midazolam  Airway Management Planned: LMA  Additional Equipment:   Intra-op Plan:   Post-operative Plan:   Informed Consent: I have reviewed the patients History and Physical,  chart, labs and discussed the procedure including the risks, benefits and alternatives for the proposed anesthesia with the patient or authorized representative who has indicated his/her understanding and acceptance.     Dental advisory given  Plan Discussed with: CRNA, Anesthesiologist and Surgeon  Anesthesia Plan Comments: (See APP note by Joslyn HyA. Kabbe, FNP   Pt declines regional anesthesia.)       Anesthesia Quick Evaluation

## 2021-09-06 NOTE — Progress Notes (Signed)
Anesthesia Chart Review:   Case: [id number] Date/Time: 09/19/21 0815   Procedure: DECOMPRESSION POSSIBLE TRANSPOSITION RIGHT ULNAR NERVE ELBOW (Right) - 90  MIN   Anesthesia type: Choice   Pre-op diagnosis: CUBITAL TUNNEL RIGHT ELBOW   [facility] / [facility]   Surgeons: Cindee Salt, MD       DISCUSSION: Pt is 60 years old with hx pulmonary HTN, diastolic CHF, chronic hypoxic/hypercapnic respiratory failure, DVT, OSA, obesity hypoventilation syndrome.  Patient uses 1 L O2 at all times.  S/p left ulnar nerve decompression 06/25/21.    VS: BP 129/74   Pulse 79   Temp 36.7 C (Oral)   Resp 18   Ht 5\' 6"  (1.676 m)   Wt (!) 173.2 kg   LMP 03/25/2013   SpO2 98%   BMI 61.64 kg/m    PROVIDERS: - PCP is Burdine, 03/27/2013, MD - Cardiologist is Ananias Pilgrim, MD. last office visit 05/08/2021.  Cleared for surgery at acceptable risk by 07/08/2021, PA on 08/08/21 - Pulmonologist is 10/08/21, MD.  Last office visit 06/03/2021   LABS: Labs reviewed: Acceptable for surgery. (all labs ordered are listed, but only abnormal results are displayed)  Labs Reviewed  BASIC METABOLIC PANEL - Abnormal; Notable for the following components:      Result Value   Chloride 97 (*)    CO2 34 (*)    Glucose, Bld 112 (*)    All other components within normal limits  CBC     IMAGES: CXR 01/01/21: No active cardiopulmonary disease   EKG 11/30/20: Sinus rhythm.  Rate 76   CV: Echo 05/01/21:  1. Left ventricular ejection fraction, by estimation, is 60 to 65%. The left ventricle has normal function. The left ventricle has no regional wall motion abnormalities. There is mild left ventricular hypertrophy. Left ventricular diastolic parameters  were normal. Normal global longitudinal strain of -20.1%.   2. Right ventricular systolic function is normal. The right ventricular size is normal. There is normal pulmonary artery systolic pressure. The estimated right ventricular systolic pressure is 15.2  mmHg.   3. Left atrial size was mildly dilated.   4. The mitral valve is grossly normal. Trivial mitral valve  regurgitation.   5. The aortic valve is tricuspid. Aortic valve regurgitation is not visualized. Mild aortic valve sclerosis is present, with no evidence of aortic valve stenosis.   6. The inferior vena cava is normal in size with greater than 50% respiratory variability, suggesting right atrial pressure of 3 mmHg.    Past Medical History:  Diagnosis Date   Arthritis    LEFT KNEE PAIN AND OA; S/P LUMBAR FUSION -- SOME BACK STIFFNESS-ESP AFTER LYING DOWN   Cardiomegaly    Chronic diastolic heart failure (HCC)    History of kidney stones    Hyperlipidemia    Hypertension    Hypothyroidism    hypothyroid   Lymphedema    OSA (obstructive sleep apnea)    Osteoarthritis    Paresthesia    fingers   Pneumonia 09/29/2020   Pulmonary hypertension (HCC)     Past Surgical History:  Procedure Laterality Date   BACK SURGERY  12/29/1997   LUMBAR FUSION- 4 CAGES   BREAST SURGERY Left    lumpectomy-benign lump   DILATION AND CURETTAGE OF UTERUS  12/29/2009   LAP BAND SURGERY  12/30/2007   TONSILLECTOMY  12/29/1982   TOTAL KNEE ARTHROPLASTY Left 05/09/2013   Procedure: LEFT TOTAL KNEE ARTHROPLASTY;  Surgeon: 07/09/2013  Dulcy Fanny, MD;  Location: [facility];  Service: Orthopedics;  Laterality: Left;   ULNAR NERVE TRANSPOSITION Left 06/25/2021   Procedure: DECOMPRESSION LEFT ULNAR NERVE ELBOW;  Surgeon: Cindee Salt, MD;  Location: [facility];  Service: Orthopedics;  Laterality: Left;  AXILLARY BLOCK   URETEROSCOPY FOR STONE REMOVAL      MEDICATIONS:  acetaminophen (TYLENOL) 500 MG tablet   aspirin EC 81 MG tablet   atorvastatin (LIPITOR) 20 MG tablet   calcium-vitamin D (OSCAL WITH D) 250-125 MG-UNIT tablet   furosemide (LASIX) 40 MG tablet   HYDROcodone-acetaminophen (NORCO) 5-325 MG tablet   levothyroxine (SYNTHROID) 175 MCG tablet   losartan (COZAAR) 50 MG tablet   Multiple Vitamin  (MULTIVITAMIN WITH MINERALS) TABS tablet   OXYGEN   polycarbophil (FIBERCON) 625 MG tablet   traMADol (ULTRAM) 50 MG tablet   No current facility-administered medications for this encounter.    If no changes, I anticipate pt can proceed with surgery as scheduled.   Rica Mast, PhD, FNP-BC [facility]/Anesthesiology Phone: [phone] 09/06/2021 2:10 PM

## 2021-09-19 ENCOUNTER — Encounter (HOSPITAL_COMMUNITY)
Admission: RE | Disposition: A | Discharge status: Home / Self Care | Payer: Self-pay | Source: Home / Self Care | Attending: Orthopedic Surgery

## 2021-09-19 ENCOUNTER — Ambulatory Visit (HOSPITAL_COMMUNITY): Payer: BC Managed Care – PPO | Admitting: Emergency Medicine

## 2021-09-19 ENCOUNTER — Other Ambulatory Visit: Payer: Self-pay

## 2021-09-19 ENCOUNTER — Ambulatory Visit (HOSPITAL_COMMUNITY)
Admission: RE | Admit: 2021-09-19 | Discharge: 2021-09-19 | Disposition: A | Discharge status: Home / Self Care | Payer: BC Managed Care – PPO | Attending: Orthopedic Surgery | Admitting: Orthopedic Surgery

## 2021-09-19 ENCOUNTER — Ambulatory Visit (HOSPITAL_COMMUNITY): Payer: BC Managed Care – PPO | Admitting: Anesthesiology

## 2021-09-19 ENCOUNTER — Encounter (HOSPITAL_COMMUNITY): Payer: Self-pay | Admitting: Orthopedic Surgery

## 2021-09-19 DIAGNOSIS — E039 Hypothyroidism, unspecified: Secondary | ICD-10-CM | POA: Diagnosis not present

## 2021-09-19 DIAGNOSIS — Z888 Allergy status to other drugs, medicaments and biological substances status: Secondary | ICD-10-CM | POA: Diagnosis not present

## 2021-09-19 DIAGNOSIS — Z96652 Presence of left artificial knee joint: Secondary | ICD-10-CM | POA: Insufficient documentation

## 2021-09-19 DIAGNOSIS — I5032 Chronic diastolic (congestive) heart failure: Secondary | ICD-10-CM | POA: Diagnosis not present

## 2021-09-19 DIAGNOSIS — Z833 Family history of diabetes mellitus: Secondary | ICD-10-CM | POA: Insufficient documentation

## 2021-09-19 DIAGNOSIS — Z881 Allergy status to other antibiotic agents status: Secondary | ICD-10-CM | POA: Insufficient documentation

## 2021-09-19 DIAGNOSIS — E785 Hyperlipidemia, unspecified: Secondary | ICD-10-CM | POA: Diagnosis not present

## 2021-09-19 DIAGNOSIS — I272 Pulmonary hypertension, unspecified: Secondary | ICD-10-CM | POA: Diagnosis not present

## 2021-09-19 DIAGNOSIS — Z8249 Family history of ischemic heart disease and other diseases of the circulatory system: Secondary | ICD-10-CM | POA: Diagnosis not present

## 2021-09-19 DIAGNOSIS — G5621 Lesion of ulnar nerve, right upper limb: Secondary | ICD-10-CM | POA: Diagnosis not present

## 2021-09-19 DIAGNOSIS — G4733 Obstructive sleep apnea (adult) (pediatric): Secondary | ICD-10-CM | POA: Insufficient documentation

## 2021-09-19 DIAGNOSIS — I110 Hypertensive heart disease with heart failure: Secondary | ICD-10-CM | POA: Insufficient documentation

## 2021-09-19 DIAGNOSIS — Z880 Allergy status to penicillin: Secondary | ICD-10-CM | POA: Insufficient documentation

## 2021-09-19 DIAGNOSIS — Z9884 Bariatric surgery status: Secondary | ICD-10-CM | POA: Diagnosis not present

## 2021-09-19 HISTORY — PX: ULNAR NERVE TRANSPOSITION: SHX2595

## 2021-09-19 SURGERY — ULNAR NERVE DECOMPRESSION/TRANSPOSITION
Anesthesia: General | Site: Arm Upper | Laterality: Right

## 2021-09-19 MED ORDER — MIDAZOLAM HCL 2 MG/2ML IJ SOLN
INTRAMUSCULAR | Status: AC
  Filled 2021-09-19: qty 2

## 2021-09-19 MED ORDER — OXYCODONE HCL 5 MG PO TABS
5.0000 mg | ORAL_TABLET | Freq: Once | ORAL | Status: DC | PRN
Start: 2021-09-19 — End: 2021-09-19

## 2021-09-19 MED ORDER — ONDANSETRON HCL 4 MG/2ML IJ SOLN
INTRAMUSCULAR | Status: DC | PRN
  Administered 2021-09-19: 4 mg via INTRAVENOUS

## 2021-09-19 MED ORDER — LACTATED RINGERS IV SOLN: INTRAVENOUS | Status: DC

## 2021-09-19 MED ORDER — OXYCODONE HCL 5 MG/5ML PO SOLN
5.0000 mg | Freq: Once | ORAL | Status: DC | PRN
Start: 2021-09-19 — End: 2021-09-19

## 2021-09-19 MED ORDER — PROPOFOL 10 MG/ML IV BOLUS
INTRAVENOUS | Status: AC
  Filled 2021-09-19: qty 20

## 2021-09-19 MED ORDER — BUPIVACAINE HCL (PF) 0.25 % IJ SOLN
INTRAMUSCULAR | Status: DC | PRN
  Administered 2021-09-19: 10 mL

## 2021-09-19 MED ORDER — LIDOCAINE HCL (PF) 1 % IJ SOLN
INTRAMUSCULAR | Status: AC
  Filled 2021-09-19: qty 30

## 2021-09-19 MED ORDER — PHENYLEPHRINE HCL (PRESSORS) 10 MG/ML IV SOLN
INTRAVENOUS | Status: DC | PRN
  Administered 2021-09-19 (×3): 100 ug via INTRAVENOUS

## 2021-09-19 MED ORDER — MIDAZOLAM HCL 5 MG/5ML IJ SOLN
INTRAMUSCULAR | Status: DC | PRN
  Administered 2021-09-19: 2 mg via INTRAVENOUS

## 2021-09-19 MED ORDER — LIDOCAINE HCL (PF) 1 % IJ SOLN
INTRAMUSCULAR | Status: DC | PRN
  Administered 2021-09-19: .1 mL

## 2021-09-19 MED ORDER — FENTANYL CITRATE (PF) 250 MCG/5ML IJ SOLN
INTRAMUSCULAR | Status: AC
  Filled 2021-09-19: qty 5

## 2021-09-19 MED ORDER — DEXAMETHASONE SODIUM PHOSPHATE 10 MG/ML IJ SOLN
INTRAMUSCULAR | Status: DC | PRN
  Administered 2021-09-19: 10 mg via INTRAVENOUS

## 2021-09-19 MED ORDER — ORAL CARE MOUTH RINSE: 15.0000 mL | Freq: Once | OROMUCOSAL | Status: AC

## 2021-09-19 MED ORDER — ONDANSETRON HCL 4 MG/2ML IJ SOLN: 4.0000 mg | Freq: Four times a day (QID) | INTRAMUSCULAR | Status: DC | PRN

## 2021-09-19 MED ORDER — PHENYLEPHRINE 40 MCG/ML (10ML) SYRINGE FOR IV PUSH (FOR BLOOD PRESSURE SUPPORT)
PREFILLED_SYRINGE | INTRAVENOUS | Status: AC
  Filled 2021-09-19: qty 10

## 2021-09-19 MED ORDER — LIDOCAINE HCL (PF) 2 % IJ SOLN
INTRAMUSCULAR | Status: AC
  Filled 2021-09-19: qty 5

## 2021-09-19 MED ORDER — EPHEDRINE SULFATE 50 MG/ML IJ SOLN
INTRAMUSCULAR | Status: DC | PRN
  Administered 2021-09-19: 5 mg via INTRAVENOUS

## 2021-09-19 MED ORDER — ONDANSETRON HCL 4 MG/2ML IJ SOLN
INTRAMUSCULAR | Status: AC
  Filled 2021-09-19: qty 2

## 2021-09-19 MED ORDER — BUPIVACAINE HCL (PF) 0.25 % IJ SOLN
INTRAMUSCULAR | Status: AC
  Filled 2021-09-19: qty 30

## 2021-09-19 MED ORDER — FENTANYL CITRATE (PF) 100 MCG/2ML IJ SOLN
INTRAMUSCULAR | Status: DC | PRN
  Administered 2021-09-19: 50 ug via INTRAVENOUS

## 2021-09-19 MED ORDER — DEXAMETHASONE SODIUM PHOSPHATE 10 MG/ML IJ SOLN
INTRAMUSCULAR | Status: AC
  Filled 2021-09-19: qty 1

## 2021-09-19 MED ORDER — FENTANYL CITRATE (PF) 100 MCG/2ML IJ SOLN: 25.0000 ug | INTRAMUSCULAR | Status: DC | PRN

## 2021-09-19 MED ORDER — HYDROCODONE-ACETAMINOPHEN 5-325 MG PO TABS: 1.0000 | ORAL_TABLET | Freq: Four times a day (QID) | ORAL | 0 refills | Status: DC | PRN

## 2021-09-19 MED ORDER — 0.9 % SODIUM CHLORIDE (POUR BTL) OPTIME
TOPICAL | Status: DC | PRN
  Administered 2021-09-19: 1000 mL

## 2021-09-19 MED ORDER — EPHEDRINE 5 MG/ML INJ
INTRAVENOUS | Status: AC
  Filled 2021-09-19: qty 5

## 2021-09-19 MED ORDER — CLINDAMYCIN PHOSPHATE 900 MG/50ML IV SOLN
900.0000 mg | INTRAVENOUS | Status: AC
  Administered 2021-09-19: 900 mg via INTRAVENOUS
  Filled 2021-09-19: qty 50

## 2021-09-19 MED ORDER — PROPOFOL 1000 MG/100ML IV EMUL
INTRAVENOUS | Status: AC
  Filled 2021-09-19: qty 100

## 2021-09-19 MED ORDER — LIDOCAINE HCL (CARDIAC) PF 100 MG/5ML IV SOSY
PREFILLED_SYRINGE | INTRAVENOUS | Status: DC | PRN
  Administered 2021-09-19: 60 mg via INTRAVENOUS

## 2021-09-19 MED ORDER — PROPOFOL 10 MG/ML IV BOLUS
INTRAVENOUS | Status: DC | PRN
  Administered 2021-09-19: 200 mg via INTRAVENOUS

## 2021-09-19 MED ORDER — CHLORHEXIDINE GLUCONATE 0.12 % MT SOLN
15.0000 mL | Freq: Once | OROMUCOSAL | Status: AC
  Administered 2021-09-19: 15 mL via OROMUCOSAL
  Filled 2021-09-19: qty 15

## 2021-09-19 SURGICAL SUPPLY — 46 items
BAG COUNTER SPONGE SURGICOUNT (BAG) ×2 IMPLANT
BAND RUBBER #18 3X1/16 STRL (MISCELLANEOUS) ×2 IMPLANT
BLADE MINI RND TIP GREEN BEAV (BLADE) IMPLANT
BNDG COHESIVE 1X5 TAN STRL LF (GAUZE/BANDAGES/DRESSINGS) IMPLANT
BNDG COHESIVE 3X5 TAN STRL LF (GAUZE/BANDAGES/DRESSINGS) IMPLANT
BNDG COHESIVE 6X5 TAN NS LF (GAUZE/BANDAGES/DRESSINGS) ×2 IMPLANT
BNDG ESMARK 4X9 LF (GAUZE/BANDAGES/DRESSINGS) ×2 IMPLANT
BNDG GAUZE ELAST 4 BULKY (GAUZE/BANDAGES/DRESSINGS) ×2 IMPLANT
CORD BIPOLAR FORCEPS 12FT (ELECTRODE) ×2 IMPLANT
CUFF TOURN SGL QUICK 18X4 (TOURNIQUET CUFF) ×4 IMPLANT
CUFF TOURN SGL QUICK 24 (TOURNIQUET CUFF)
CUFF TRNQT CYL 24X4X16.5-23 (TOURNIQUET CUFF) IMPLANT
DRAPE OEC MINIVIEW 54X84 (DRAPES) IMPLANT
DRSG KUZMA FLUFF (GAUZE/BANDAGES/DRESSINGS) IMPLANT
DRSG PAD ABDOMINAL 8X10 ST (GAUZE/BANDAGES/DRESSINGS) ×2 IMPLANT
DURAPREP 26ML APPLICATOR (WOUND CARE) ×2 IMPLANT
GAUZE SPONGE 2X2 8PLY STRL LF (GAUZE/BANDAGES/DRESSINGS) IMPLANT
GAUZE SPONGE 4X4 12PLY STRL (GAUZE/BANDAGES/DRESSINGS) ×2 IMPLANT
GAUZE XEROFORM 1X8 LF (GAUZE/BANDAGES/DRESSINGS) ×2 IMPLANT
GLOVE SURG ENC MOIS LTX SZ6.5 (GLOVE) ×2 IMPLANT
GLOVE SURG ENC MOIS LTX SZ7.5 (GLOVE) ×2 IMPLANT
GOWN STRL REUS W/ TWL LRG LVL3 (GOWN DISPOSABLE) ×2 IMPLANT
GOWN STRL REUS W/ TWL XL LVL3 (GOWN DISPOSABLE) ×1 IMPLANT
GOWN STRL REUS W/TWL LRG LVL3 (GOWN DISPOSABLE) ×4
GOWN STRL REUS W/TWL XL LVL3 (GOWN DISPOSABLE) ×2
KIT BASIN OR (CUSTOM PROCEDURE TRAY) ×2 IMPLANT
KIT TURNOVER KIT B (KITS) ×2 IMPLANT
MANIFOLD NEPTUNE II (INSTRUMENTS) ×2 IMPLANT
NEEDLE HYPO 25GX1X1/2 BEV (NEEDLE) IMPLANT
NS IRRIG 1000ML POUR BTL (IV SOLUTION) ×2 IMPLANT
PACK ORTHO EXTREMITY (CUSTOM PROCEDURE TRAY) ×2 IMPLANT
PAD ARMBOARD 7.5X6 YLW CONV (MISCELLANEOUS) ×4 IMPLANT
PAD CAST 4YDX4 CTTN HI CHSV (CAST SUPPLIES) IMPLANT
PADDING CAST COTTON 4X4 STRL (CAST SUPPLIES)
SPONGE GAUZE 2X2 STER 10/PKG (GAUZE/BANDAGES/DRESSINGS)
SUT ETHILON 5 0 PS 2 18 (SUTURE) IMPLANT
SUT SILK 4 0 PS 2 (SUTURE) IMPLANT
SUT VIC AB 2-0 SH 27 (SUTURE) ×2
SUT VIC AB 2-0 SH 27X BRD (SUTURE) ×1 IMPLANT
SUT VIC AB 4-0 PS2 18 (SUTURE) ×2 IMPLANT
SUT VICRYL 4-0 PS2 18IN ABS (SUTURE) IMPLANT
SYR CONTROL 10ML LL (SYRINGE) IMPLANT
TOWEL GREEN STERILE (TOWEL DISPOSABLE) ×2 IMPLANT
TOWEL GREEN STERILE FF (TOWEL DISPOSABLE) ×2 IMPLANT
UNDERPAD 30X36 HEAVY ABSORB (UNDERPADS AND DIAPERS) ×2 IMPLANT
WATER STERILE IRR 1000ML POUR (IV SOLUTION) ×2 IMPLANT

## 2021-09-19 NOTE — Brief Op Note (Signed)
09/19/2021  9:51 AM  PATIENT:  Azucena Freed  60 y.o. female  PRE-OPERATIVE DIAGNOSIS:  CUBITAL TUNNEL RIGHT ELBOW  POST-OPERATIVE DIAGNOSIS:  CUBITAL TUNNEL RIGHT ELBOW  PROCEDURE:  Procedure(s) with comments: DECOMPRESSION POSSIBLE TRANSPOSITION RIGHT ULNAR NERVE ELBOW (Right) - 90  MIN  SURGEON:  Surgeon(s) and Role:    * Cindee Salt, MD - Primary    * Betha Loa, MD - Assisting  PHYSICIAN ASSISTANT:   ASSISTANTS: K Antoino Westhoff,MD   ANESTHESIA:   local and general  EBL:  62ml  BLOOD ADMINISTERED:none  DRAINS: none   LOCAL MEDICATIONS USED:  BUPIVICAINE   SPECIMEN:  No Specimen  DISPOSITION OF SPECIMEN:  N/A  COUNTS:  YES  TOURNIQUET:   Total Tourniquet Time Documented: Upper Arm (Right) - 25 minutes Total: Upper Arm (Right) - 25 minutes   DICTATION: .Reubin Milan Dictation  PLAN OF CARE: Discharge to home after PACU  PATIENT DISPOSITION:  PACU - hemodynamically stable.

## 2021-09-19 NOTE — H&P (Signed)
Jocelyn Morris is an 60 y.o. female.   Chief Complaint: numbness right hand HPI: Jocelyn Morris is a 60 year old right-hand-dominant female referred by Dr. Lucrezia Starch for consultation regarding numbness and tingling bilateral hands. She is complaining of aching at her elbow moderate in nature especially left side. She states this began approximately 36months ago. She states the right side began after this. They are centrally numb to the ring and small fingers on a constant basis at the present time. She has no history of injury to the hands to the elbow or to the neck. She recalls no history of injury inciting this. She has not had any treatment other than a brace which she wears at night which has not given her relief. She states left is worse than the right. She has a history of thyroid problems arthritis no history of diabetes or gout. Family history is positive diabetes thyroid problems and arthritis. She has been tested for diabetes. She has had nerve conductions done revealing cubital tunnel syndrome bilaterally with velocities dropped to 37 on the right and 33 on the left at the elbows . She is now11weeks following left cubital tunnel decompression she is doing very well with that she continues to complain of numbness and tingling ring and small finger of her right hand.  Past Medical History:  Diagnosis Date   Arthritis    LEFT KNEE PAIN AND OA; S/P LUMBAR FUSION -- SOME BACK STIFFNESS-ESP AFTER LYING DOWN   Cardiomegaly    Chronic diastolic heart failure (HCC)    History of kidney stones    Hyperlipidemia    Hypertension    Hypothyroidism    hypothyroid   Lymphedema    OSA (obstructive sleep apnea)    Osteoarthritis    Paresthesia    fingers   Pneumonia 09/29/2020   Pulmonary hypertension (HCC)     Past Surgical History:  Procedure Laterality Date   BACK SURGERY  12/29/1997   LUMBAR FUSION- 4 CAGES   BREAST SURGERY Left    lumpectomy-benign lump   DILATION AND CURETTAGE OF UTERUS   12/29/2009   LAP BAND SURGERY  12/30/2007   TONSILLECTOMY  12/29/1982   TOTAL KNEE ARTHROPLASTY Left 05/09/2013   Procedure: LEFT TOTAL KNEE ARTHROPLASTY;  Surgeon: Loanne Drilling, MD;  Location: [facility];  Service: Orthopedics;  Laterality: Left;   ULNAR NERVE TRANSPOSITION Left 06/25/2021   Procedure: DECOMPRESSION LEFT ULNAR NERVE ELBOW;  Surgeon: Cindee Salt, MD;  Location: [facility];  Service: Orthopedics;  Laterality: Left;  AXILLARY BLOCK   URETEROSCOPY FOR STONE REMOVAL      Family History  Problem Relation Age of Onset   Diabetes Father    Alcoholism Father    CAD Father    Diabetes Mellitus II Mother    Stroke Mother    Rheum arthritis Maternal Grandfather    Heart attack Paternal Grandfather    Social History:  reports that she has never smoked. She has never used smokeless tobacco. She reports that she does not drink alcohol and does not use drugs.  Allergies:  Allergies  Allergen Reactions   Cymbalta [Duloxetine Hcl] Other (See Comments)    Pt states it gave her bad headache   Keflex [Cephalexin]     Upset stomach   Penicillins Swelling and Rash    Swelling to throat  .Did it involve swelling of the face/tongue/throat, SOB, or low BP? Yes Did it involve sudden or severe rash/hives, skin peeling, or any reaction on the inside of  your mouth or nose? Yes Did you need to seek medical attention at a hospital or doctor's office? Yes When did it last happen?      1981 If all above answers are "NO", may proceed with cephalosporin use.     No medications prior to admission.    No results found for this or any previous visit (from the past 48 hour(s)).  No results found.   Pertinent items are noted in HPI.  Last menstrual period 03/25/2013.  General appearance: alert, cooperative, and appears stated age Head: Normocephalic, without obvious abnormality Neck: no JVD Resp: clear to auscultation bilaterally Cardio: regular rate and rhythm, S1, S2 normal, no murmur,  click, rub or gallop GI: soft, non-tender; bowel sounds normal; no masses,  no organomegaly Extremities: numbness right hand Pulses: 2+ and symmetric Skin: Skin color, texture, turgor normal. No rashes or lesions Neurologic: Grossly normal Incision/Wound: na  Assessment/Plan Diagnosis right cubital tunnel syndrome  She would like to proceed to have the right side operated on. Pre pari and postoperative course been discussed along with risk complications. She is aware that there is no guarantee to the surgery the possibility of infection recurrence injury to arteries nerves tendons incomplete relief symptoms dystrophy. This will be scheduled as an outpatient under regional or general anesthesia depending on anesthesia and consultation. She is a BMI greater than 60 and is on oxygen she did not particularly enjoy the block.   Cindee Salt 09/19/2021, 5:24 AM

## 2021-09-19 NOTE — Discharge Instructions (Addendum)

## 2021-09-19 NOTE — Transfer of Care (Signed)
Immediate Anesthesia Transfer of Care Note  Patient: Jocelyn Morris  Procedure(s) Performed: DECOMPRESSION POSSIBLE TRANSPOSITION RIGHT ULNAR NERVE ELBOW (Right: Arm Upper)  Patient Location: PACU  Anesthesia Type:General  Level of Consciousness: awake, alert , oriented and patient cooperative  Airway & Oxygen Therapy: Patient Spontanous Breathing and Patient connected to nasal cannula oxygen  Post-op Assessment: Report given to RN, Post -op Vital signs reviewed and stable and Patient moving all extremities X 4  Post vital signs: Reviewed and stable  Last Vitals:  Vitals Value Taken Time  BP 126/59 09/19/21 0959  Temp    Pulse 75 09/19/21 1000  Resp 15 09/19/21 1000  SpO2 97 % 09/19/21 1000  Vitals shown include unvalidated device data.  Last Pain:  Vitals:   09/19/21 0656  TempSrc:   PainSc: 1       Patients Stated Pain Goal: 0 (09/19/21 0656)  Complications: No notable events documented.

## 2021-09-19 NOTE — Op Note (Signed)
I assisted Surgeon(s) and Role:    * Cindee Salt, MD - Primary    * Betha Loa, MD - Assisting on the Procedure(s): DECOMPRESSION POSSIBLE TRANSPOSITION RIGHT ULNAR NERVE ELBOW on 09/19/2021.  I provided assistance on this case as follows: retraction soft tissues, positioning of arm.  Electronically signed by: Betha Loa, MD Date: 09/19/2021 Time: 9:51 AM

## 2021-09-19 NOTE — Anesthesia Procedure Notes (Signed)
Procedure Name: LMA Insertion Date/Time: 09/19/2021 9:07 AM Performed by: Shanon Payor, CRNA Pre-anesthesia Checklist: Patient identified, Emergency Drugs available, Suction available, Patient being monitored and Timeout performed Patient Re-evaluated:Patient Re-evaluated prior to induction Oxygen Delivery Method: Circle system utilized Preoxygenation: Pre-oxygenation with 100% oxygen Induction Type: IV induction Ventilation: Mask ventilation without difficulty LMA: LMA inserted LMA Size: 4.0 Number of attempts: 1 Placement Confirmation: positive ETCO2 and breath sounds checked- equal and bilateral Tube secured with: Tape Dental Injury: Teeth and Oropharynx as per pre-operative assessment

## 2021-09-19 NOTE — Op Note (Signed)
NAME: Jocelyn Morris MEDICAL RECORD NO: [id number] DATE OF BIRTH: June 05, 1961 FACILITY: [facility] LOCATION: [facility] OR PHYSICIAN: Nicki Reaper, MD   OPERATIVE REPORT   DATE OF PROCEDURE: 09/19/21    PREOPERATIVE DIAGNOSIS: Cubital tunnel syndrome right arm   POSTOPERATIVE DIAGNOSIS: Same   PROCEDURE: Decompression ulnar nerve right elbow   SURGEON: Cindee Salt, M.D.   ASSISTANT: Betha Loa, MD   ANESTHESIA:  General and Local   INTRAVENOUS FLUIDS:  Per anesthesia flow sheet.   ESTIMATED BLOOD LOSS:  Minimal.   COMPLICATIONS:  None.   SPECIMENS:  none   TOURNIQUET TIME:    Total Tourniquet Time Documented: Upper Arm (Right) - 25 minutes Total: Upper Arm (Right) - 25 minutes    DISPOSITION:  Stable to PACU.   INDICATIONS: Patient is a 60 year old female with bilateral cubital tunnel syndrome.  She has undergone surgery on the left side.  She has done very well.  Nerve conductions are positive for and nonresponsive to conservative treatment.  She is elected undergo decompressive possible transposition right elbow ulnar nerve.  The parent postoperative course been discussed along with risks and complications.  She is aware there is no guarantee to the surgery possibility of infection recurrence injury to arteries nerves tendons incomplete relief symptoms dystrophy.  Preoperative area the patient is seen the extremity marked by both patient and surgeon antibiotic given  OPERATIVE COURSE: Patient is brought to the operating room placed in the supine position with the right arm free.  General anesthetic was carried out without difficulty under the direction of the anesthesia department.  She was prepped with ChloraPrep.  3-minute time allowed timeout taken confirm patient procedure.  The limb was exsanguinated with an Esmarch bandage turn placed high in the arm was inflated to 300 mmHg.  A longitudinal incision was made over the medial epicondyle right elbow carried down through  subcutaneous tissue.  Bleeders were electrocauterized with bipolar.  Posterior branches of the medial antebrachial cutaneous nerve of the forearm were not seen they were look for.  The ulnar nerve was identified and released on its posterior aspect releasing Osborne's fascia and leaving it attached to the epicondyle.  The distal component of the nerve was attended to first.  A blunt and sharp dissection was used to isolate the skin forearm fascia from the underlying flexor carpi ulnaris which was then split into the dorsal and palmar aspects by releasing the superficial fascia the deep fascia was then released after placing knee and stool retractor allowing visualization of the deep fascia with a suture was then released with blunt scissors.  This was done for approximately 8 cm distally.  Attention was directed proximally.  The skin and subcutaneous tissue was then released from the brachial fascia.  The nerve was then isolated and the brachial fascia was then released for approximately 8 cm proximally.  Flexion of the elbow revealed no dislocation of the nerve.  The wound was copiously irrigated with saline.  Osborne's fascia was then repaired to the posterior skin flap with 2-0 Vicryl sutures to prevent subluxation.  The subcutaneous tissue was closed with interrupted 4-0 Vicryl and skin with interrupted 4-0 Nurolon sutures.  Local infiltration with quarter percent bupivacaine without epinephrine was given 10 cc was used.  Sterile compressive dressing was applied.  I deflation of the tourniquet all angers pink.  She was taken to the recovery room for observation in satisfactory condition.  She will be discharged home to return to the [facility]  [place] in 1 week and Tylenol ibuprofen for pain with Norco for breakthrough.   Cindee Salt, MD Electronically signed, 09/19/21

## 2021-09-20 ENCOUNTER — Encounter (HOSPITAL_COMMUNITY): Payer: Self-pay | Admitting: Orthopedic Surgery

## 2021-09-20 NOTE — Anesthesia Postprocedure Evaluation (Signed)
Anesthesia Post Note  Patient: Jocelyn Morris  Procedure(s) Performed: DECOMPRESSION POSSIBLE TRANSPOSITION RIGHT ULNAR NERVE ELBOW (Right: Arm Upper)     Patient location during evaluation: PACU Anesthesia Type: General Level of consciousness: awake and alert Pain management: pain level controlled Vital Signs Assessment: post-procedure vital signs reviewed and stable Respiratory status: spontaneous breathing, nonlabored ventilation, respiratory function stable and patient connected to nasal cannula oxygen Cardiovascular status: blood pressure returned to baseline and stable Postop Assessment: no apparent nausea or vomiting Anesthetic complications: no   No notable events documented.  Last Vitals:  Vitals:   09/19/21 0959 09/19/21 1014  BP: (!) 126/59 116/71  Pulse: 76 72  Resp: 18 14  Temp:    SpO2: 94% 99%    Last Pain:  Vitals:   09/19/21 1029  TempSrc:   PainSc: 2                  Louetta Hollingshead S

## 2021-12-11 ENCOUNTER — Telehealth: Payer: Self-pay | Admitting: Cardiology

## 2021-12-11 NOTE — Telephone Encounter (Signed)
° °  Pt said she dropped off some papers to be filled out by Dr. Antoine Poche at [facility] office last Wednesday and she is f/u up since she has not heard anything yet from nurse or Dr. Antoine Poche

## 2021-12-12 NOTE — Telephone Encounter (Signed)
Reviewed paperwork and advised pt since these forms are needing to be completed d/t her hospitalization at [facility], we are not able to complete them.  Advised we do not have the records, access to them and would not be able to release information from another facility. Forms will be mailed back to pt's home address at her request.  Pt states understanding of the above and will check with her PCP to see if that office can complete.

## 2022-06-09 ENCOUNTER — Encounter: Payer: Self-pay | Admitting: Pulmonary Disease

## 2022-06-09 ENCOUNTER — Ambulatory Visit: Payer: BC Managed Care – PPO | Admitting: Pulmonary Disease

## 2022-06-09 VITALS — BP 150/74 | HR 77 | Temp 98.3°F | Ht 66.0 in | Wt 389.2 lb

## 2022-06-09 DIAGNOSIS — E662 Morbid (severe) obesity with alveolar hypoventilation: Secondary | ICD-10-CM

## 2022-06-09 DIAGNOSIS — G4733 Obstructive sleep apnea (adult) (pediatric): Secondary | ICD-10-CM

## 2022-06-09 DIAGNOSIS — J9612 Chronic respiratory failure with hypercapnia: Secondary | ICD-10-CM | POA: Diagnosis not present

## 2022-06-09 DIAGNOSIS — J9611 Chronic respiratory failure with hypoxia: Secondary | ICD-10-CM

## 2022-06-09 NOTE — Patient Instructions (Signed)
You can look up SleepWeaver Cloth CPAP mask on-line and call if you want an order placed for this  Follow up in 1 year

## 2022-06-09 NOTE — Progress Notes (Signed)
Pulmonary, Critical Care, and Sleep Medicine  Chief Complaint  Patient presents with   Follow-up    Patient states she does not understand machine, wears 1 liter O2.     Constitutional:  BP (!) 150/74 (BP Location: Left Wrist, Patient Position: Sitting, Cuff Size: Normal)   Pulse 77   Temp 98.3 F (36.8 C) (Oral)   Ht 5\' 6"  (1.676 m)   Wt (!) 389 lb 3.2 oz (176.5 kg)   LMP 03/25/2013   SpO2 96%   BMI 62.82 kg/m   Past Medical History:  Diastolic CHF, Nephrolithiasis, HLD, HTN, Hypothyroidism, Lymphedema  Past Surgical History:  She  has a past surgical history that includes Back surgery (12/29/1997); LAP BAND SURGERY (12/30/2007); Dilation and curettage of uterus (12/29/2009); Tonsillectomy (12/29/1982); URETEROSCOPY FOR STONE REMOVAL; Total knee arthroplasty (Left, 05/09/2013); Breast surgery (Left); Ulnar nerve transposition (Left, 06/25/2021); and Ulnar nerve transposition (Right, 09/19/2021).  Brief Summary:  Jocelyn Morris is a 61 y.o. female with obstructive sleep apnea, obesity hypoventilation syndrome, and chronic hypoxic/hypercapnic respiratory failure.      Subjective:   She is here with her husband.  Weight at last visit in June 2022 was 379 lbs.  Went up to 400 lbs.  Started on Wegovy and has lost about 16 lbs so far.  She is also doing Weight Watcher APP.  She uses Bipap nightly.  Using 1 liters oxygen 24/7.  Going on cruise [place] later this Summer.  Still has trouble with mask leak.  She is a mouth breather.  She has tried mask liner.  Physical Exam:   Appearance - well kempt, wearing oxygen  ENMT - no sinus tenderness, no oral exudate, no LAN, Mallampati 3 airway, no stridor  Respiratory - equal breath sounds bilaterally, no wheezing or rales  CV - s1s2 regular rate and rhythm, no murmurs  Ext - no clubbing, no edema  Skin - no rashes  Psych - normal mood and affect     Pulmonary testing:  ABG 10/10/20 >> pH 7.39, PCO2  82, PO2 55  Chest Imaging:  CT angio chest 10/08/20 >> large Rt pleural effusion  Sleep Tests:  HST 12/06/20 >> AHI 55.3, SpO2 low 70% Bipap 03/11/21 >> Bipap 16/12 with 2 liter O2 ONO with Bipap 08/08/21 >> test time 7 hrs 10 min.  Baseline SpO2 87%, low SpO2 69%.  Spent 5 hrs 7 min with SpO2 < 88%. Bipap 05/07/22 to 06/05/22 >> used on 30 of 30 nights with average 6 hrs 58 min.  Average AHI 7.8 with Bipap 14/10 cm H2O.  Some air leak.  Cardiac Tests:  Echo 10/08/20 >> EF 65 to 70%, grade 1 DD, mod/severe RV dilation and systolic dysfx, PASP 80 mmHg Doppler legs 10/15/20 >> DVT Rt saphenofemoral junction Echo 05/01/21 >> EF 60 to 65%, mild LVH, RVSP 15.2 mmHg, mild LA dilation  Social History:  She  reports that she has never smoked. She has never used smokeless tobacco. She reports that she does not drink alcohol and does not use drugs.  Family History:  Her family history includes Alcoholism in her father; CAD in her father; Diabetes in her father; Diabetes Mellitus II in her mother; Heart attack in her paternal grandfather; Rheum arthritis in her maternal grandfather; Stroke in her mother.     Assessment/Plan:   Obstructive sleep apnea. - she is compliant with Bipap and reports benefit from therapy - [facility] - order for current Bipap  placed 03/13/21 - continue Bipap 14/10 cm H2O - she will look up cloth mask and let me know if she wants an order  for this  Chronic hypoxic and hypercapnic respiratory failure. - relate to obstructive sleep apnea and obesity hypoventilation syndrome - using 1 liter oxygen 24/7  Obesity. - she had previous lap band that was revised - recently started on wegovy  Time Spent Involved in Patient Care on Day of Examination:  28 minutes  Follow up:   Patient Instructions  You can look up SleepWeaver Cloth CPAP mask on-line and call if you want an order placed for this  Follow up in 1 year  Medication List:    Allergies as of 06/09/2022       Reactions   Cymbalta [duloxetine Hcl] Other (See Comments)   Pt states it gave her bad headache   Keflex [cephalexin]    Upset stomach   Penicillins Swelling, Rash   Swelling to throat .Did it involve swelling of the face/tongue/throat, SOB, or low BP? Yes Did it involve sudden or severe rash/hives, skin peeling, or any reaction on the inside of your mouth or nose? Yes Did you need to seek medical attention at a hospital or doctor's office? Yes When did it last happen?      1981 If all above answers are "NO", may proceed with cephalosporin use.        Medication List        Accurate as of June 09, 2022 11:32 AM. If you have any questions, ask your nurse or doctor.          acetaminophen 500 MG tablet Commonly known as: TYLENOL Take 1,000 mg by mouth every 6 (six) hours as needed for moderate pain.   aspirin EC 81 MG tablet Take 1 tablet (81 mg total) by mouth daily. Swallow whole.   atorvastatin 20 MG tablet Commonly known as: LIPITOR Take 20 mg by mouth at bedtime.   calcium-vitamin D 250-125 MG-UNIT tablet Commonly known as: OSCAL WITH D Take 1 tablet by mouth daily.   furosemide 40 MG tablet Commonly known as: LASIX Take 40 mg by mouth 2 (two) times daily.   HYDROcodone-acetaminophen 5-325 MG tablet Commonly known as: Norco Take 1 tablet by mouth every 6 (six) hours as needed for moderate pain.   HYDROcodone-acetaminophen 5-325 MG tablet Commonly known as: Norco Take 1 tablet by mouth every 6 (six) hours as needed for moderate pain.   levothyroxine 175 MCG tablet Commonly known as: SYNTHROID Take 175 mcg by mouth daily.   losartan 50 MG tablet Commonly known as: COZAAR Take 50 mg by mouth daily.   multivitamin with minerals Tabs tablet Take 1 tablet by mouth daily.   OXYGEN Inhale 1 L into the lungs continuous.   polycarbophil 625 MG tablet Commonly known as: FIBERCON Take 625 mg by mouth as needed for  moderate constipation.   traMADol 50 MG tablet Commonly known as: ULTRAM Take 1-2 tablets (50-100 mg total) by mouth every 6 (six) hours as needed. What changed:  how much to take when to take this   Wegovy 2.4 MG/0.75ML Soaj Generic drug: Semaglutide-Weight Management SMARTSIG:0.75 Milliliter(s) SUB-Q Once a Week        Signature:  Coralyn Helling, MD [facility] Pulmonary/Critical Care Pager - [phone] 06/09/2022, 11:32 AM

## 2022-07-07 ENCOUNTER — Ambulatory Visit (INDEPENDENT_AMBULATORY_CARE_PROVIDER_SITE_OTHER): Payer: BC Managed Care – PPO

## 2022-07-07 ENCOUNTER — Encounter: Payer: Self-pay | Admitting: Cardiology

## 2022-07-07 ENCOUNTER — Ambulatory Visit: Payer: BC Managed Care – PPO | Admitting: Cardiology

## 2022-07-07 VITALS — BP 126/82 | HR 79 | Ht 66.0 in | Wt 381.2 lb

## 2022-07-07 DIAGNOSIS — G4730 Sleep apnea, unspecified: Secondary | ICD-10-CM

## 2022-07-07 DIAGNOSIS — R002 Palpitations: Secondary | ICD-10-CM | POA: Diagnosis not present

## 2022-07-07 DIAGNOSIS — I5032 Chronic diastolic (congestive) heart failure: Secondary | ICD-10-CM

## 2022-07-07 NOTE — Patient Instructions (Signed)
Testing/Procedures:  Jocelyn Morris- Long Term Monitor Instructions  Your physician has requested you wear a ZIO patch monitor for 3 days.  This is a single patch monitor. [employer] supplies one patch monitor per enrollment. Additional stickers are not available. Please do not apply patch if you will be having a Nuclear Stress Test,  Echocardiogram, Cardiac CT, MRI, or Chest Xray during the period you would be wearing the  monitor. The patch cannot be worn during these tests. You cannot remove and re-apply the  ZIO XT patch monitor.  Your ZIO patch monitor will be mailed 3 day USPS to your address on file. It may take 3-5 days  to receive your monitor after you have been enrolled.  Once you have received your monitor, please review the enclosed instructions. Your monitor  has already been registered assigning a specific monitor serial # to you.  Billing and Patient Assistance Program Information  We have supplied [employer] with any of your insurance information on file for billing purposes. [employer] offers a sliding scale Patient Assistance Program for patients that do not have  insurance, or whose insurance does not completely cover the cost of the ZIO monitor.  You must apply for the Patient Assistance Program to qualify for this discounted rate.  To apply, please call [employer] at [phone], select option 4, select option 2, ask to apply for  Patient Assistance Program. [name] will ask your household income, and how many people  are in your household. They will quote your out-of-pocket cost based on that information.  [employer] will also be able to set up a 46-month interest-free payment plan if needed.  Applying the monitor   Shave hair from upper left chest.  Hold abrader disc by orange tab. Rub abrader in 40 strokes over the upper left chest as  indicated in your monitor instructions.  Clean area with 4 enclosed alcohol pads. Let dry.  Apply patch as indicated in monitor instructions.  Patch will be placed under collarbone on left  side of chest with arrow pointing upward.  Rub patch adhesive wings for 2 minutes. Remove white label marked "1". Remove the white  label marked "2". Rub patch adhesive wings for 2 additional minutes.  While looking in a mirror, press and release button in center of patch. A small green light will  flash 3-4 times. This will be your only indicator that the monitor has been turned on.  Do not shower for the first 24 hours. You may shower after the first 24 hours.  Press the button if you feel a symptom. You will hear a small click. Record Date, Time and  Symptom in the Patient Logbook.  When you are ready to remove the patch, follow instructions on the last 2 pages of Patient  Logbook. Stick patch monitor onto the last page of Patient Logbook.  Place Patient Logbook in the blue and white box. Use locking tab on box and tape box closed  securely. The blue and white box has prepaid postage on it. Please place it in the mailbox as  soon as possible. Your physician should have your test results approximately 7 days after the  monitor has been [facility]  Call IMonsonat [phone] you have questions regarding  your ZIO XT patch monitor. Call them immediately if you see an orange light blinking on your  monitor.  If your monitor falls off in less than 4 days, contact our Monitor department at [phone]  If your monitor becomes loose or falls off after 4 days call [employer] at [phone] for  suggestions on securing your monitor    Follow-Up: At [facility], you and your health needs are our priority.  As part of our continuing mission to provide you with exceptional heart care, we have created designated Provider Care Teams.  These Care Teams include your primary Cardiologist (physician) and Advanced Practice Providers (APPs -  Physician Assistants and Nurse Practitioners) who all work together to  provide you with the care you need, when you need it.  We recommend signing up for the patient portal called "MyChart".  Sign up information is provided on this After Visit Summary.  MyChart is used to connect with patients for Virtual Visits (Telemedicine).  Patients are able to view lab/test results, encounter notes, upcoming appointments, etc.  Non-urgent messages can be sent to your provider as well.   To learn more about what you can do with MyChart, go to [email or web address].    Your next appointment:   6 month(s)  The format for your next appointment:   In Person  Provider:   [name], MD       Important Information About Sugar

## 2022-07-07 NOTE — Progress Notes (Unsigned)
Enrolled for [employer] to mail a ZIO XT long term holter monitor to the patients address on file.  

## 2022-07-07 NOTE — Progress Notes (Signed)
Cardiology Office Note   Date:  07/07/2022   ID:  Jocelyn Morris, DOB [birth year], MRN [id number]  PCP:  Juliette Alcide, MD  Cardiologist:   [name], MD Referring:  Juliette Alcide, MD  Chief Complaint  Patient presents with   Palpitations      History of Present Illness: Jocelyn Morris is a 61 y.o. female who presents for evaluation of pulmonary HTN, diastolic HF.  Prior to the last visit she was at [facility].   She was admitted for AKI with and respiratory failure with acute on chronic diastolic dysfunction.  She had a CT without PE.  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  She desaturated overnight and was thought to have sleep apnea.  She was also found to have a DVT.  She was discharged to rehab on O2.   Of note she was told to stop Ambien and nabumetone at discharge.  Eliquis and Lasix BID were added at discharge.    She was sent home on 2 liters of O2.       Since I last saw her she called this morning because she had palpitations.  She has abnormal schedule.  She has not had a rhythm problem before.  Her brother just had a stroke during apparently had to have "a hole in his heart" closed.  She has never had these palpitations before.  She is otherwise been actually doing pretty well walking up to half miles up an incline try to get some exercise.  She wears her oxygen all the time.  She denies any presyncope or syncope.  She denies any chest pressure, neck or arm discomfort.  She has had no PND or orthopnea.  She said the palpitations started at rest.  She felt fluttering.  It went on for about an hour.  She said the heart rate went from the 117 to the 100s which is above her baseline.  It ended spontaneously.  Try to drink some water to get rid of it.  She does not think it was a trigger.   Past Medical History:  Diagnosis Date   Arthritis     LEFT KNEE PAIN AND OA; S/P LUMBAR FUSION -- SOME BACK STIFFNESS-ESP AFTER LYING DOWN   Cardiomegaly    Chronic diastolic heart failure (HCC)    History of kidney stones    Hyperlipidemia    Hypertension    Hypothyroidism    hypothyroid   Lymphedema    OSA (obstructive sleep apnea)    Osteoarthritis    Paresthesia    fingers   Pneumonia 09/29/2020   Pulmonary hypertension (HCC)     Past Surgical History:  Procedure Laterality Date   BACK SURGERY  12/29/1997   LUMBAR FUSION- 4 CAGES   BREAST SURGERY Left    lumpectomy-benign lump   DILATION AND CURETTAGE OF UTERUS  12/29/2009   LAP BAND SURGERY  12/30/2007   TONSILLECTOMY  12/29/1982   TOTAL KNEE ARTHROPLASTY Left 05/09/2013   Procedure: LEFT TOTAL KNEE ARTHROPLASTY;  Surgeon: Loanne Drilling, MD;  Location: [facility] ORS;  Service: Orthopedics;  Laterality: Left;   ULNAR NERVE TRANSPOSITION Left 06/25/2021   Procedure: DECOMPRESSION LEFT ULNAR NERVE ELBOW;  Surgeon: Cindee Salt, MD;  Location: [facility] OR;  Service: Orthopedics;  Laterality: Left;  AXILLARY BLOCK   ULNAR NERVE TRANSPOSITION Right 09/19/2021   Procedure: DECOMPRESSION POSSIBLE  TRANSPOSITION RIGHT ULNAR NERVE ELBOW;  Surgeon: Cindee Salt, MD;  Location: [facility] OR;  Service: Orthopedics;  Laterality: Right;  90  MIN   URETEROSCOPY FOR STONE REMOVAL       Current Outpatient Medications  Medication Sig Dispense Refill   acetaminophen (TYLENOL) 500 MG tablet Take 1,000 mg by mouth every 6 (six) hours as needed for moderate pain.     aspirin EC 81 MG tablet Take 1 tablet (81 mg total) by mouth daily. Swallow whole. 90 tablet 3   atorvastatin (LIPITOR) 20 MG tablet Take 20 mg by mouth at bedtime.     calcium-vitamin D (OSCAL WITH D) 250-125 MG-UNIT tablet Take 1 tablet by mouth daily.     furosemide (LASIX) 40 MG tablet Take 40 mg by mouth 2 (two) times daily.     HYDROcodone-acetaminophen (NORCO) 5-325 MG tablet Take 1 tablet by mouth every 6 (six) hours as needed for moderate pain.  30 tablet 0   HYDROcodone-acetaminophen (NORCO) 5-325 MG tablet Take 1 tablet by mouth every 6 (six) hours as needed for moderate pain. 30 tablet 0   levothyroxine (SYNTHROID) 175 MCG tablet Take 175 mcg by mouth daily.     losartan (COZAAR) 50 MG tablet Take 50 mg by mouth daily.     Multiple Vitamin (MULTIVITAMIN WITH MINERALS) TABS tablet Take 1 tablet by mouth daily.     OXYGEN Inhale 1 L into the lungs continuous.     polycarbophil (FIBERCON) 625 MG tablet Take 625 mg by mouth as needed for moderate constipation.     traMADol (ULTRAM) 50 MG tablet Take 1-2 tablets (50-100 mg total) by mouth every 6 (six) hours as needed. (Patient taking differently: Take 50 mg by mouth at bedtime.) 60 tablet 0   WEGOVY 2.4 MG/0.75ML SOAJ SMARTSIG:0.75 Milliliter(s) SUB-Q Once a Week     No current facility-administered medications for this visit.    Allergies:   Cymbalta [duloxetine hcl], Keflex [cephalexin], and Penicillins    ROS:  Please see the history of present illness.   Otherwise, review of systems are positive for none.   All other systems are reviewed and negative.    PHYSICAL EXAM: VS:  BP 126/82   Pulse 79   Ht 5\' 6"  (1.676 m)   Wt (!) 381 lb 3.2 oz (172.9 kg)   LMP 03/25/2013   SpO2 95%   BMI 61.53 kg/m  , BMI Body mass index is 61.53 kg/m. GENERAL:  Well appearing NECK:  No jugular venous distention, waveform within normal limits, carotid upstroke brisk and symmetric, no bruits, no thyromegaly LUNGS:  Clear to auscultation bilaterally CHEST:  Unremarkable HEART:  PMI not displaced or sustained,S1 and S2 within normal limits, no S3, no S4, no clicks, no rubs, no murmurs ABD:  Flat, positive bowel sounds normal in frequency in pitch, no bruits, no rebound, no guarding, no midline pulsatile mass, no hepatomegaly, no splenomegaly EXT:  2 plus pulses throughout, no edema, no cyanosis no clubbing   EKG:  EKG is  ordered today. Sinus rhythm, rate 79, axis within normal limits,  intervals within normal limits, no acute ST-T wave changes.   Recent Labs: 09/05/2021: BUN 20; Creatinine, Ser 0.87; Hemoglobin 13.6; Platelets 350; Potassium 3.7; Sodium 139    Lipid Panel No results found for: "CHOL", "TRIG", "HDL", "CHOLHDL", "VLDL", "LDLCALC", "LDLDIRECT"    Wt Readings from Last 3 Encounters:  07/07/22 (!) 381 lb 3.2 oz (172.9 kg)  06/09/22 (!) 389 lb 3.2 oz (176.5 kg)  09/19/21 (!) 380 lb (172.4 kg)      Other studies Reviewed: Additional studies/ records that were reviewed today include:  Labs Review of the above records demonstrates:  Please see elsewhere in the note.     ASSESSMENT AND PLAN:    PALPITATIONS: I will apply a 3-day Zio patch.  She is going to get a wearable type and recording EKG.  SLEEP APNEA:   He is being BiPAP.  MORBID OBESITY:    We have previously had conversations about this.  CHRONIC DIASTOLIC HF:      Seems to be euvolemic.  No change in therapy.  DVT:   She had appropriate treatment for blood clot.  Change in therapy.   HYPOVENTILATION OBESITY SYNDROME:   She has had no RV dysfunction.  No change in therapy.  HYPOTHYROID:   She previously had a markedly elevated TSH but her most recent blood work in February was normal.  MURMUR:   She had an echo last year and there were no significant abnormalities.  No change in therapy.    Current medicines are reviewed at length with the patient today.  The patient does not have concerns regarding medicines.  The following changes have been made: None  Labs/ tests ordered today include:     Orders Placed This Encounter  Procedures   LONG TERM MONITOR (3-14 DAYS)   EKG 12-Lead     Disposition:   FU with me in 6 months   Signed, [name], MD  07/07/2022 11:20 AM    [facility]

## 2022-07-09 DIAGNOSIS — R002 Palpitations: Secondary | ICD-10-CM

## 2022-07-31 ENCOUNTER — Encounter: Payer: Self-pay | Admitting: *Deleted

## 2022-08-06 ENCOUNTER — Ambulatory Visit: Payer: BC Managed Care – PPO | Admitting: Cardiology

## 2022-08-07 ENCOUNTER — Encounter: Payer: Self-pay | Admitting: Pulmonary Disease

## 2022-08-07 ENCOUNTER — Telehealth: Payer: Self-pay | Admitting: Pulmonary Disease

## 2022-08-07 NOTE — Telephone Encounter (Signed)
Called and notified patient that letter was available and she confirmed she was able to see it in [place]. Nothing further needed at this time.

## 2022-08-07 NOTE — Telephone Encounter (Signed)
Letter created in Epic.  She should be able to access in MyChart.

## 2022-08-07 NOTE — Telephone Encounter (Signed)
Pt going on a cruise on Sat.  Asking about a prescription for her BiPap and for portable oxygen concentrator in case it would be needed for her trip.  Please advise.     Called and spoke to patient and she asks that Dr. Craige Cotta write her a letter for her cruise trip saying that she uses a bipap machine and portable oxygen (1-2LO2) just in case she has trouble with being allowed to bring them on the ship. Patient asks that we make the letter available to her in [place].   Dr. Craige Cotta please advise, is this something you are willing to do?

## 2022-09-23 ENCOUNTER — Telehealth: Payer: Self-pay | Admitting: Pulmonary Disease

## 2022-09-23 DIAGNOSIS — G4733 Obstructive sleep apnea (adult) (pediatric): Secondary | ICD-10-CM

## 2022-09-23 NOTE — Telephone Encounter (Signed)
Called and spoke to patient. She verifies she wants to change DMEs from [facility] to anywhere else. Recommended to patient we try changing DME to [facility] for bipap first since she gets her O2 from them and she was okay with this. Nothing further needed for now. Order placed.

## 2022-12-27 ENCOUNTER — Other Ambulatory Visit: Payer: Self-pay

## 2022-12-27 ENCOUNTER — Emergency Department (HOSPITAL_COMMUNITY)
Admission: EM | Admit: 2022-12-27 | Discharge: 2022-12-28 | Disposition: A | Discharge status: Home / Self Care | Payer: BC Managed Care – PPO | Attending: Emergency Medicine | Admitting: Emergency Medicine

## 2022-12-27 ENCOUNTER — Encounter (HOSPITAL_COMMUNITY): Payer: Self-pay | Admitting: Emergency Medicine

## 2022-12-27 DIAGNOSIS — Z7982 Long term (current) use of aspirin: Secondary | ICD-10-CM | POA: Diagnosis not present

## 2022-12-27 DIAGNOSIS — Z79899 Other long term (current) drug therapy: Secondary | ICD-10-CM | POA: Diagnosis not present

## 2022-12-27 DIAGNOSIS — R319 Hematuria, unspecified: Secondary | ICD-10-CM | POA: Diagnosis present

## 2022-12-27 DIAGNOSIS — E039 Hypothyroidism, unspecified: Secondary | ICD-10-CM | POA: Insufficient documentation

## 2022-12-27 DIAGNOSIS — I110 Hypertensive heart disease with heart failure: Secondary | ICD-10-CM | POA: Insufficient documentation

## 2022-12-27 DIAGNOSIS — N3000 Acute cystitis without hematuria: Secondary | ICD-10-CM | POA: Diagnosis not present

## 2022-12-27 DIAGNOSIS — I5032 Chronic diastolic (congestive) heart failure: Secondary | ICD-10-CM | POA: Diagnosis not present

## 2022-12-27 DIAGNOSIS — Z79890 Hormone replacement therapy (postmenopausal): Secondary | ICD-10-CM | POA: Insufficient documentation

## 2022-12-27 NOTE — ED Triage Notes (Signed)
Pt states she began to have difficulty with urination on Thursday. Tonight at about 7pm she started to have increased pain when trying to urinate. Pt took azo with no relief.

## 2022-12-28 LAB — URINALYSIS, ROUTINE W REFLEX MICROSCOPIC
Bilirubin Urine: NEGATIVE
Glucose, UA: NEGATIVE mg/dL
Hgb urine dipstick: NEGATIVE
Ketones, ur: NEGATIVE mg/dL
Leukocytes,Ua: NEGATIVE
Nitrite: POSITIVE — AB
Protein, ur: NEGATIVE mg/dL
Specific Gravity, Urine: 1.017 (ref 1.005–1.030)
pH: 5 (ref 5.0–8.0)

## 2022-12-28 MED ORDER — PHENAZOPYRIDINE HCL 95 MG PO TABS: 100.0000 mg | ORAL_TABLET | Freq: Three times a day (TID) | ORAL | 0 refills | Status: AC | PRN

## 2022-12-28 MED ORDER — FOSFOMYCIN TROMETHAMINE 3 G PO PACK
3.0000 g | PACK | Freq: Once | ORAL | Status: AC
  Administered 2022-12-28: 3 g via ORAL
  Filled 2022-12-28: qty 3

## 2022-12-28 MED ORDER — PHENAZOPYRIDINE HCL 100 MG PO TABS
100.0000 mg | ORAL_TABLET | Freq: Once | ORAL | Status: AC
  Administered 2022-12-28: 100 mg via ORAL
  Filled 2022-12-28: qty 1

## 2022-12-28 NOTE — Discharge Instructions (Addendum)
Seen today for urinary symptoms.  You may have a urinary tract infection.  You are given a dose of fosfomycin.  Your urine has been cultured and you will receive a phone call if you need additional antibiotics.  If you develop fevers or flank pain, you should be reevaluated.

## 2022-12-28 NOTE — ED Provider Notes (Signed)
[facility] EMERGENCY DEPARTMENT Provider Note   CSN: [id number] Arrival date & time: 12/27/22  2323     History  Chief Complaint  Patient presents with   Urinary Retention    Jocelyn Morris is a 61 y.o. female.  HPI     This is a 61 year old female who presents with urinary frequency and urgency.  Patient states that she began to have some difficulty urinating Thursday night.  She took a dose of Azo with some relief.  She felt somewhat better on Friday but had recurrence of frequency and urgency today.  She states that she has significant pressure and urge to go to the bathroom; however, only urinates several drops without relief.  Reports dysuria.  No back pain or flank pain.  Home Medications Prior to Admission medications   Medication Sig Start Date End Date Taking? Authorizing Provider  phenazopyridine (PYRIDIUM) 95 MG tablet Take 1 tablet (95 mg total) by mouth 3 (three) times daily as needed for pain. 12/28/22  Yes Cayman Brogden, Mayer Masker, MD  acetaminophen (TYLENOL) 500 MG tablet Take 1,000 mg by mouth every 6 (six) hours as needed for moderate pain.    [provider]  aspirin EC 81 MG tablet Take 1 tablet (81 mg total) by mouth daily. Swallow whole. 05/08/21   [name], MD  atorvastatin (LIPITOR) 20 MG tablet Take 20 mg by mouth at bedtime. 03/07/21   [provider]  calcium-vitamin D (OSCAL WITH D) 250-125 MG-UNIT tablet Take 1 tablet by mouth daily.    [provider]  furosemide (LASIX) 40 MG tablet Take 40 mg by mouth 2 (two) times daily.    [provider]  HYDROcodone-acetaminophen (NORCO) 5-325 MG tablet Take 1 tablet by mouth every 6 (six) hours as needed for moderate pain. 06/25/21   Cindee Salt, MD  HYDROcodone-acetaminophen (NORCO) 5-325 MG tablet Take 1 tablet by mouth every 6 (six) hours as needed for moderate pain. 09/19/21   Cindee Salt, MD  levothyroxine (SYNTHROID) 175 MCG tablet Take 175 mcg by mouth daily. 01/03/20    [provider]  losartan (COZAAR) 50 MG tablet Take 50 mg by mouth daily. 12/27/19   [provider]  Multiple Vitamin (MULTIVITAMIN WITH MINERALS) TABS tablet Take 1 tablet by mouth daily.    [provider]  OXYGEN Inhale 1 L into the lungs continuous.    [provider]  polycarbophil (FIBERCON) 625 MG tablet Take 625 mg by mouth as needed for moderate constipation.    [provider]  traMADol (ULTRAM) 50 MG tablet Take 1-2 tablets (50-100 mg total) by mouth every 6 (six) hours as needed. Patient taking differently: Take 50 mg by mouth at bedtime. 05/10/13   Perkins, Alexzandrew L, PA-C  WEGOVY 2.4 MG/0.75ML SOAJ SMARTSIG:0.75 Milliliter(s) SUB-Q Once a Week 05/13/22   [provider]      Allergies    Cymbalta [duloxetine hcl], Keflex [cephalexin], and Penicillins    Review of Systems   Review of Systems  Constitutional:  Negative for fever.  Genitourinary:  Positive for dysuria, frequency and urgency. Negative for flank pain.  All other systems reviewed and are negative.   Physical Exam Updated Vital Signs BP 135/81   Pulse 69   Temp 98.3 F (36.8 C) (Oral)   Resp 17   Ht 1.676 m (5\' 6" )   Wt (!) 172.9 kg   LMP 03/25/2013   SpO2 99%   BMI 61.52 kg/m  Physical Exam Vitals and nursing  note reviewed.  Constitutional:      Appearance: She is well-developed. She is obese. She is not ill-appearing.  HENT:     Head: Normocephalic and atraumatic.  Eyes:     Pupils: Pupils are equal, round, and reactive to light.  Cardiovascular:     Rate and Rhythm: Normal rate and regular rhythm.     Heart sounds: Normal heart sounds.  Pulmonary:     Effort: Pulmonary effort is normal. No respiratory distress.     Breath sounds: No wheezing.     Comments: Nasal cannula in place Abdominal:     Palpations: Abdomen is soft.     Tenderness: There is no abdominal tenderness. There is no guarding or rebound.  Musculoskeletal:      Cervical back: Neck supple.  Skin:    General: Skin is warm and dry.  Neurological:     Mental Status: She is alert and oriented to person, place, and time.  Psychiatric:        Mood and Affect: Mood normal.     ED Results / Procedures / Treatments   Labs (all labs ordered are listed, but only abnormal results are displayed) Labs Reviewed  URINALYSIS, ROUTINE W REFLEX MICROSCOPIC - Abnormal; Notable for the following components:      Result Value   Color, Urine AMBER (*)    Nitrite POSITIVE (*)    Bacteria, UA FEW (*)    All other components within normal limits  URINE CULTURE    EKG None  Radiology No results found.  Procedures Procedures    Medications Ordered in ED Medications  phenazopyridine (PYRIDIUM) tablet 100 mg (has no administration in time range)  fosfomycin (MONUROL) packet 3 g (3 g Oral Given 12/28/22 0104)    ED Course/ Medical Decision Making/ A&P                           Medical Decision Making Amount and/or Complexity of Data Reviewed Labs: ordered.  Risk OTC drugs. Prescription drug management.   This patient presents to the ED for concern of dysuria, frequency, urgency, this involves an extensive number of treatment options, and is a complaint that carries with it a high risk of complications and morbidity.  I considered the following differential and admission for this acute, potentially life threatening condition.  The differential diagnosis includes acute cystitis, pyelonephritis, urinary retention, bladder spasm, interstitial cystitis  MDM:    This is a 60 year old female who presents with urinary symptoms.  She is nontoxic and vital signs are reassuring.  Denies systemic symptoms.  Symptoms are fairly consistent with acute cystitis.  Bladder scan shows 125 in the bladder.  She does not appear to be significantly retaining.  She was able to provide a sample.  Sample is nitrite positive with few bacteria.  Unclear whether this is a false  nitrite positive given the 1 dose of Azo that she took over 24 hours ago; however, given her symptoms, would like to treat.  She has multiple allergies to medications.  Will give a dose of fosfomycin.  No signs or symptoms of urosepsis or pyelonephritis.  Patient was given Pyridium for bladder spasm.  (Labs, imaging, consults)  Labs: I Ordered, and personally interpreted labs.  The pertinent results include: Urinalysis, urine culture  Imaging Studies ordered: I ordered imaging studies including bladder scan I independently visualized and interpreted imaging. I agree with the radiologist interpretation  Additional history obtained from husband at  bedside.  External records from outside source obtained and reviewed including prior evaluations  Cardiac Monitoring: The patient was maintained on a cardiac monitor.  I personally viewed and interpreted the cardiac monitored which showed an underlying rhythm of: Sinus rhythm  Reevaluation: After the interventions noted above, I reevaluated the patient and found that they have :stayed the same  Social Determinants of Health:  lives independently  Disposition: Discharge  Co morbidities that complicate the patient evaluation  Past Medical History:  Diagnosis Date   Arthritis    LEFT KNEE PAIN AND OA; S/P LUMBAR FUSION -- SOME BACK STIFFNESS-ESP AFTER LYING DOWN   Cardiomegaly    Chronic diastolic heart failure ([place])    History of kidney stones    Hyperlipidemia    Hypertension    Hypothyroidism    hypothyroid   Lymphedema    OSA (obstructive sleep apnea)    Osteoarthritis    Paresthesia    fingers   Pneumonia 09/29/2020   Pulmonary hypertension ([place])      Medicines Meds ordered this encounter  Medications   fosfomycin (MONUROL) packet 3 g   phenazopyridine (PYRIDIUM) tablet 100 mg   phenazopyridine (PYRIDIUM) 95 MG tablet    Sig: Take 1 tablet (95 mg total) by mouth 3 (three) times daily as needed for pain.    Dispense:  12  tablet    Refill:  0    I have reviewed the patients home medicines and have made adjustments as needed  Problem List / ED Course: Problem List Items Addressed This Visit   None Visit Diagnoses     Acute cystitis without hematuria    -  Primary                   Final Clinical Impression(s) / ED Diagnoses Final diagnoses:  Acute cystitis without hematuria    Rx / DC Orders ED Discharge Orders          Ordered    phenazopyridine (PYRIDIUM) 95 MG tablet  3 times daily PRN        12/28/22 0118              Merryl Hacker, MD 12/28/22 0122

## 2022-12-30 LAB — URINE CULTURE: Culture: 40000 — AB

## 2023-01-03 NOTE — Progress Notes (Deleted)
Cardiology Office Note   Date:  01/03/2023   ID:  ROSEMOND LYTTLE, DOB [birth year], MRN [id number]  PCP:  Juliette Alcide, MD  Cardiologist:   [name], MD Referring:  Juliette Alcide, MD  No chief complaint on file.     History of Present Illness: Jocelyn Morris is a 62 y.o. female who presents for evaluation of pulmonary HTN, diastolic HF.  Prior to the last visit she was at [facility].   She was admitted for AKI with and respiratory failure with acute on chronic diastolic dysfunction.  She had a CT without PE.  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  She desaturated overnight and was thought to have sleep apnea.  She was also found to have a DVT.  She was discharged to rehab on O2.   Of note she was told to stop Ambien and nabumetone at discharge.  Eliquis and Lasix BID were added at discharge.    She was sent home on 2 liters of O2.     She had palpitations.  She had no arrhythmias on telemetry.  ***   Since I last saw her ***  ***  she called this morning because she had palpitations.  She has abnormal schedule.  She has not had a rhythm problem before.  Her brother just had a stroke during apparently had to have "a hole in his heart" closed.  She has never had these palpitations before.  She is otherwise been actually doing pretty well walking up to half miles up an incline try to get some exercise.  She wears her oxygen all the time.  She denies any presyncope or syncope.  She denies any chest pressure, neck or arm discomfort.  She has had no PND or orthopnea.  She said the palpitations started at rest.  She felt fluttering.  It went on for about an hour.  She said the heart rate went from the 117 to the 100s which is above her baseline.  It ended spontaneously.  Try to drink some water to get rid of it.  She does not think it was a trigger.   Past  Medical History:  Diagnosis Date   Arthritis    LEFT KNEE PAIN AND OA; S/P LUMBAR FUSION -- SOME BACK STIFFNESS-ESP AFTER LYING DOWN   Cardiomegaly    Chronic diastolic heart failure (HCC)    History of kidney stones    Hyperlipidemia    Hypertension    Hypothyroidism    hypothyroid   Lymphedema    OSA (obstructive sleep apnea)    Osteoarthritis    Paresthesia    fingers   Pneumonia 09/29/2020   Pulmonary hypertension (HCC)     Past Surgical History:  Procedure Laterality Date   BACK SURGERY  12/29/1997   LUMBAR FUSION- 4 CAGES   BREAST SURGERY Left    lumpectomy-benign lump   DILATION AND CURETTAGE OF UTERUS  12/29/2009   LAP BAND SURGERY  12/30/2007   TONSILLECTOMY  12/29/1982   TOTAL KNEE ARTHROPLASTY Left 05/09/2013   Procedure: LEFT TOTAL KNEE ARTHROPLASTY;  Surgeon: Loanne Drilling, MD;  Location: [facility];  Service: Orthopedics;  Laterality: Left;   ULNAR NERVE TRANSPOSITION Left 06/25/2021   Procedure: DECOMPRESSION LEFT ULNAR NERVE ELBOW;  Surgeon: Cindee Salt, MD;  Location: [facility];  Service: Orthopedics;  Laterality: Left;  AXILLARY BLOCK  ULNAR NERVE TRANSPOSITION Right 09/19/2021   Procedure: DECOMPRESSION POSSIBLE TRANSPOSITION RIGHT ULNAR NERVE ELBOW;  Surgeon: Cindee Salt, MD;  Location: [facility];  Service: Orthopedics;  Laterality: Right;  90  MIN   URETEROSCOPY FOR STONE REMOVAL       Current Outpatient Medications  Medication Sig Dispense Refill   acetaminophen (TYLENOL) 500 MG tablet Take 1,000 mg by mouth every 6 (six) hours as needed for moderate pain.     aspirin EC 81 MG tablet Take 1 tablet (81 mg total) by mouth daily. Swallow whole. 90 tablet 3   atorvastatin (LIPITOR) 20 MG tablet Take 20 mg by mouth at bedtime.     calcium-vitamin D (OSCAL WITH D) 250-125 MG-UNIT tablet Take 1 tablet by mouth daily.     furosemide (LASIX) 40 MG tablet Take 40 mg by mouth 2 (two) times daily.     HYDROcodone-acetaminophen (NORCO) 5-325 MG tablet Take 1 tablet by  mouth every 6 (six) hours as needed for moderate pain. 30 tablet 0   HYDROcodone-acetaminophen (NORCO) 5-325 MG tablet Take 1 tablet by mouth every 6 (six) hours as needed for moderate pain. 30 tablet 0   levothyroxine (SYNTHROID) 175 MCG tablet Take 175 mcg by mouth daily.     losartan (COZAAR) 50 MG tablet Take 50 mg by mouth daily.     Multiple Vitamin (MULTIVITAMIN WITH MINERALS) TABS tablet Take 1 tablet by mouth daily.     OXYGEN Inhale 1 L into the lungs continuous.     phenazopyridine (PYRIDIUM) 95 MG tablet Take 1 tablet (95 mg total) by mouth 3 (three) times daily as needed for pain. 12 tablet 0   polycarbophil (FIBERCON) 625 MG tablet Take 625 mg by mouth as needed for moderate constipation.     traMADol (ULTRAM) 50 MG tablet Take 1-2 tablets (50-100 mg total) by mouth every 6 (six) hours as needed. (Patient taking differently: Take 50 mg by mouth at bedtime.) 60 tablet 0   WEGOVY 2.4 MG/0.75ML SOAJ SMARTSIG:0.75 Milliliter(s) SUB-Q Once a Week     No current facility-administered medications for this visit.    Allergies:   Cymbalta [duloxetine hcl], Keflex [cephalexin], and Penicillins    ROS:  Please see the history of present illness.   Otherwise, review of systems are positive for ***.   All other systems are reviewed and negative.    PHYSICAL EXAM: VS:  LMP 03/25/2013  , BMI There is no height or weight on file to calculate BMI. GENERAL:  Well appearing NECK:  No jugular venous distention, waveform within normal limits, carotid upstroke brisk and symmetric, no bruits, no thyromegaly LUNGS:  Clear to auscultation bilaterally CHEST:  Unremarkable HEART:  PMI not displaced or sustained,S1 and S2 within normal limits, no S3, no S4, no clicks, no rubs, *** murmurs ABD:  Flat, positive bowel sounds normal in frequency in pitch, no bruits, no rebound, no guarding, no midline pulsatile mass, no hepatomegaly, no splenomegaly EXT:  2 plus pulses throughout, no edema, no cyanosis no  clubbing    ***GENERAL:  Well appearing NECK:  No jugular venous distention, waveform within normal limits, carotid upstroke brisk and symmetric, no bruits, no thyromegaly LUNGS:  Clear to auscultation bilaterally CHEST:  Unremarkable HEART:  PMI not displaced or sustained,S1 and S2 within normal limits, no S3, no S4, no clicks, no rubs, no murmurs ABD:  Flat, positive bowel sounds normal in frequency in pitch, no bruits, no rebound, no guarding, no midline pulsatile mass, no hepatomegaly, no  splenomegaly EXT:  2 plus pulses throughout, no edema, no cyanosis no clubbing   EKG:  EKG is *** ordered today. Sinus rhythm, rate ***, axis within normal limits, intervals within normal limits, no acute ST-T wave changes.   Recent Labs: No results found for requested labs within last 365 days.    Lipid Panel No results found for: "CHOL", "TRIG", "HDL", "CHOLHDL", "VLDL", "LDLCALC", "LDLDIRECT"    Wt Readings from Last 3 Encounters:  12/27/22 (!) 381 lb 2.8 oz (172.9 kg)  07/07/22 (!) 381 lb 3.2 oz (172.9 kg)  06/09/22 (!) 389 lb 3.2 oz (176.5 kg)      Other studies Reviewed: Additional studies/ records that were reviewed today include:  *** Review of the above records demonstrates:  Please see elsewhere in the note.     ASSESSMENT AND PLAN:    PALPITATIONS:  ***  I will apply a 3-day Zio patch.  She is going to get a wearable type and recording EKG.  SLEEP APNEA:  ***   He is being BiPAP.  MORBID OBESITY:    *** We have previously had conversations about this.  CHRONIC DIASTOLIC HF:      Seems to be euvolemic.  No change in therapy.  HYPOVENTILATION OBESITY SYNDROME:   ***  She has had no RV dysfunction.  No change in therapy.  HYPOTHYROID:  ***    She previously had a markedly elevated TSH but her most recent blood work in February was normal.   Current medicines are reviewed at length with the patient today.  The patient does not have concerns regarding medicines.  The  following changes have been made: ***  Labs/ tests ordered today include:   ***  No orders of the defined types were placed in this encounter.    Disposition:   FU with me in *** months   Signed, Minus Breeding, MD  01/03/2023 6:21 PM    [facility]

## 2023-01-06 ENCOUNTER — Ambulatory Visit: Payer: BC Managed Care – PPO | Admitting: Cardiology

## 2023-02-24 DIAGNOSIS — R002 Palpitations: Secondary | ICD-10-CM | POA: Insufficient documentation

## 2023-02-24 DIAGNOSIS — D6869 Other thrombophilia: Secondary | ICD-10-CM | POA: Insufficient documentation

## 2023-02-24 DIAGNOSIS — I5032 Chronic diastolic (congestive) heart failure: Secondary | ICD-10-CM | POA: Insufficient documentation

## 2023-02-24 NOTE — Progress Notes (Unsigned)
Cardiology Office Note   Date:  02/26/2023   ID:  Azzaria, Olear [birth year], MRN [id number]  PCP:  Curlene Labrum, MD  Cardiologist:   Minus Breeding, MD Referring:  Curlene Labrum, MD  Chief Complaint  Patient presents with   Shortness of Breath      History of Present Illness: Jocelyn Morris is a 62 y.o. female who presents for evaluation of pulmonary HTN, diastolic HF.  Prior to the last visit she was at [facility].   She was admitted for AKI with and respiratory failure with acute on chronic diastolic dysfunction.  She had a CT without PE.  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  She desaturated overnight and was thought to have sleep apnea.  She was also found to have a DVT.  She was discharged to rehab on O2.   Of note she was told to stop Ambien and nabumetone at discharge.  Eliquis and Lasix BID were added at discharge.    She was sent home on 2 liters of O2.       Since I last saw her she denies any new cardiovascular symptoms.  She is lost quite a bit of weight on Wegovy although she thinks she is going to lose that coverage in April.  She denies any new palpitations, presyncope or syncope.  She has had no new PND or orthopnea.  She had no weight gain or edema.     Past Medical History:  Diagnosis Date   Arthritis    LEFT KNEE PAIN AND OA; S/P LUMBAR FUSION -- SOME BACK STIFFNESS-ESP AFTER LYING DOWN   Cardiomegaly    Chronic diastolic heart failure ([place])    History of kidney stones    Hyperlipidemia    Hypertension    Hypothyroidism    hypothyroid   Lymphedema    OSA (obstructive sleep apnea)    Osteoarthritis    Paresthesia    fingers   Pneumonia 09/29/2020   Pulmonary hypertension ([place])     Past Surgical History:  Procedure Laterality Date   BACK SURGERY  12/29/1997   LUMBAR FUSION- 4 CAGES   BREAST SURGERY Left     lumpectomy-benign lump   DILATION AND CURETTAGE OF UTERUS  12/29/2009   LAP BAND SURGERY  12/30/2007   TONSILLECTOMY  12/29/1982   TOTAL KNEE ARTHROPLASTY Left 05/09/2013   Procedure: LEFT TOTAL KNEE ARTHROPLASTY;  Surgeon: Gearlean Alf, MD;  Location: [facility];  Service: Orthopedics;  Laterality: Left;   ULNAR NERVE TRANSPOSITION Left 06/25/2021   Procedure: DECOMPRESSION LEFT ULNAR NERVE ELBOW;  Surgeon: Daryll Brod, MD;  Location: [place];  Service: Orthopedics;  Laterality: Left;  AXILLARY BLOCK   ULNAR NERVE TRANSPOSITION Right 09/19/2021   Procedure: DECOMPRESSION POSSIBLE TRANSPOSITION RIGHT ULNAR NERVE ELBOW;  Surgeon: Daryll Brod, MD;  Location: [place];  Service: Orthopedics;  Laterality: Right;  90  MIN   URETEROSCOPY FOR STONE REMOVAL       Current Outpatient Medications  Medication Sig Dispense Refill   acetaminophen (TYLENOL) 500 MG tablet Take 1,000 mg by mouth every 6 (six) hours as needed for moderate pain.     aspirin EC 81 MG tablet Take 1 tablet (81 mg total) by mouth daily. Swallow whole. 90 tablet 3   atorvastatin (LIPITOR) 20 MG tablet Take 20 mg by mouth at bedtime.  calcium-vitamin D (OSCAL WITH D) 250-125 MG-UNIT tablet Take 1 tablet by mouth daily.     furosemide (LASIX) 40 MG tablet Take 40 mg by mouth 2 (two) times daily.     levothyroxine (SYNTHROID) 175 MCG tablet Take 175 mcg by mouth daily.     losartan (COZAAR) 50 MG tablet Take 50 mg by mouth daily.     Multiple Vitamin (MULTIVITAMIN WITH MINERALS) TABS tablet Take 1 tablet by mouth daily.     OXYGEN Inhale 1 L into the lungs continuous.     phenazopyridine (PYRIDIUM) 95 MG tablet Take 1 tablet (95 mg total) by mouth 3 (three) times daily as needed for pain. 12 tablet 0   polycarbophil (FIBERCON) 625 MG tablet Take 625 mg by mouth as needed for moderate constipation.     traMADol (ULTRAM) 50 MG tablet Take 1-2 tablets (50-100 mg total) by mouth every 6 (six) hours as needed. (Patient taking differently:  Take 50 mg by mouth at bedtime.) 60 tablet 0   valACYclovir (VALTREX) 1000 MG tablet Take 1,000 mg by mouth as needed.     WEGOVY 2.4 MG/0.75ML SOAJ SMARTSIG:0.75 Milliliter(s) SUB-Q Once a Week     No current facility-administered medications for this visit.    Allergies:   Cymbalta [duloxetine hcl], Keflex [cephalexin], and Penicillins    ROS:  Please see the history of present illness.   Otherwise, review of systems are positive for none.   All other systems are reviewed and negative.    PHYSICAL EXAM: VS:  BP 122/84 Comment: automatic  Pulse 76   Ht '5\' 6"'$  (1.676 m)   Wt (!) 350 lb (158.8 kg)   LMP 03/25/2013   SpO2 95%   BMI 56.49 kg/m  , BMI Body mass index is 56.49 kg/m. GENERAL:  Well appearing NECK:  No jugular venous distention, waveform within normal limits, carotid upstroke brisk and symmetric, no bruits, no thyromegaly LUNGS:  Clear to auscultation bilaterally CHEST:  Unremarkable HEART:  PMI not displaced or sustained,S1 and S2 within normal limits, no S3, no S4, no clicks, no rubs, no murmurs ABD:  Flat, positive bowel sounds normal in frequency in pitch, no bruits, no rebound, no guarding, no midline pulsatile mass, no hepatomegaly, no splenomegaly EXT:  2 plus pulses throughout, no edema, no cyanosis no clubbing   EKG:  EKG is  not ordered today.   Recent Labs: No results found for requested labs within last 365 days.    Lipid Panel No results found for: "CHOL", "TRIG", "HDL", "CHOLHDL", "VLDL", "LDLCALC", "LDLDIRECT"    Wt Readings from Last 3 Encounters:  02/26/23 (!) 350 lb (158.8 kg)  12/27/22 (!) 381 lb 2.8 oz (172.9 kg)  07/07/22 (!) 381 lb 3.2 oz (172.9 kg)      Other studies Reviewed: Additional studies/ records that were reviewed today include:  Labs Review of the above records demonstrates:  Please see elsewhere in the note.     ASSESSMENT AND PLAN:    PALPITATIONS:   He had no arrhythmias on 3 day Zio patch.  No change in therapy.    SLEEP APNEA:   She is using  BiPAP.  MORBID OBESITY:    I congratulated her on her weight loss.    CHRONIC DIASTOLIC HF:      She seems to be euvolemic.    DVT: She is on appropriate therapy.  No change in therapy.    HYPOVENTILATION OBESITY SYNDROME:   She has had no evidence of RV  dysfunction.  No change in therapy.    Current medicines are reviewed at length with the patient today.  The patient does not have concerns regarding medicines.  The following changes have been made: None  Labs/ tests ordered today include:   None  No orders of the defined types were placed in this encounter.    Disposition:   FU with me in 12 months   Signed, Minus Breeding, MD  02/26/2023 11:06 AM    [place]

## 2023-02-26 ENCOUNTER — Encounter: Payer: Self-pay | Admitting: Cardiology

## 2023-02-26 ENCOUNTER — Ambulatory Visit: Payer: BC Managed Care – PPO | Attending: Cardiology | Admitting: Cardiology

## 2023-02-26 VITALS — BP 122/84 | HR 76 | Ht 66.0 in | Wt 350.0 lb

## 2023-02-26 DIAGNOSIS — G4730 Sleep apnea, unspecified: Secondary | ICD-10-CM

## 2023-02-26 DIAGNOSIS — I5032 Chronic diastolic (congestive) heart failure: Secondary | ICD-10-CM

## 2023-02-26 DIAGNOSIS — R002 Palpitations: Secondary | ICD-10-CM

## 2023-02-26 DIAGNOSIS — D6869 Other thrombophilia: Secondary | ICD-10-CM

## 2023-02-26 NOTE — Patient Instructions (Signed)
Medication Instructions:  Your physician recommends that you continue on your current medications as directed. Please refer to the Current Medication list given to you today.  *If you need a refill on your cardiac medications before your next appointment, please call your pharmacy*  Follow-Up: At [facility], you and your health needs are our priority.  As part of our continuing mission to provide you with exceptional heart care, we have created designated Provider Care Teams.  These Care Teams include your primary Cardiologist (physician) and Advanced Practice Providers (APPs -  Physician Assistants and Nurse Practitioners) who all work together to provide you with the care you need, when you need it.  We recommend signing up for the patient portal called "MyChart".  Sign up information is provided on this After Visit Summary.  MyChart is used to connect with patients for Virtual Visits (Telemedicine).  Patients are able to view lab/test results, encounter notes, upcoming appointments, etc.  Non-urgent messages can be sent to your provider as well.   To learn more about what you can do with MyChart, go to [email or web address].    Your next appointment:   12 month(s)  Provider:   Minus Breeding, MD

## 2023-06-11 ENCOUNTER — Encounter: Payer: Self-pay | Admitting: Nurse Practitioner

## 2023-06-11 ENCOUNTER — Ambulatory Visit: Payer: BC Managed Care – PPO | Admitting: Nurse Practitioner

## 2023-06-11 VITALS — BP 119/58 | HR 66 | Ht 66.0 in | Wt 358.8 lb

## 2023-06-11 DIAGNOSIS — G4733 Obstructive sleep apnea (adult) (pediatric): Secondary | ICD-10-CM | POA: Diagnosis not present

## 2023-06-11 DIAGNOSIS — J9611 Chronic respiratory failure with hypoxia: Secondary | ICD-10-CM | POA: Diagnosis not present

## 2023-06-11 NOTE — Patient Instructions (Signed)
Continue to use BiPAP every night, minimum of 4-6 hours a night.  Change equipment every 30 days or as directed by DME. Wash your tubing with warm soap and water daily, hang to dry. Wash humidifier portion weekly. Use bottled, distilled water - change daily Be aware of reduced alertness and do not drive or operate heavy machinery if experiencing this or drowsiness.  Exercise encouraged, as tolerated. Healthy weight management discussed.  Avoid or decrease alcohol consumption and medications that make you more sleepy, if possible. Notify if persistent daytime sleepiness occurs even with consistent use of CPAP.  Overnight oximetry - someone will contact you to schedule this.   If your oxygen study shows that you stay above 88% for most of the night, we will discontinue your oxygen   Follow up in 6 months with Jocelyn Morris. If symptoms worsen, please contact office for sooner follow up or seek emergency care.

## 2023-06-11 NOTE — Progress Notes (Addendum)
@Patient  ID: Jocelyn Morris, female    DOB: Nov 09, 1961, 62 y.o.   MRN: [id number]  Chief Complaint  Patient presents with   Follow-up    Referring provider: Juliette Alcide, MD  HPI: 62 year old female, never smoker followed for OSA on BiPAP, OHS, chronic respiratory failure. She is a patient of Dr. Evlyn Courier and last seen 06/09/2022. Past medical history significant for hx of DVT, CHF, hypothyroid, obesity.  TEST/EVENTS: ] 10/08/2020 CTA chest: large right pleural effusion 10/08/2020 echo: EF 65-70%, GIDD; mod/severe RV dilation and systolic dysfunction. PASP 80 mmHg 12/06/2020 HST: AHI 55.3/h, SpO2 low 70% 03/11/2021 BiPAP titration >> BiPAP 16/12 with 2 lpm O2 05/01/2021 echo: EF 60-65%, mild LVH, RVSP 15.2 mmHg, mild LA dilation   06/09/2022: OV with Dr. Craige Cotta. Weight at last visit 379 lb. Went up to 40 lb. Working on weight loss again with We[employer] and down 16 lb so far. Doing Weight Watcher App. Uses BiPAP nightly. Still has trouble with mask leak. She has tried mask liner. Advised to look up info on cloth mask and notify if she is interested. BiPAP 14/10. Receives benefit from use.   06/11/2023 Today  Patient presents today for follow up. She has been doing really well since she was here last. She has been continuing to work on weight loss. Insurance stopped [place] so she's looking at other options. She's down another 30 lb since she was last here. She has not had any issues with her breathing. Feels like her activity tolerance has improved. No cough or chest congestion. No leg swelling, orthopnea, PND. October 2021 - started on supplemental oxygen 3 lpm. She weaned herself down to 1 lpm. She went on a cruise in January this year, 2024, and didn't take it and doesn't feel like she needs it. Wants to see if she can discontinue her oxygen. She has not noticed any levels <90% at home. Regarding her BiPAP, she has been sleeping well. Wakes feeling rested. She switched to a small, foam mask and she  feels this is working better for her. Doesn't notice as much leaks.  No drowsy driving.   04/06/8118-1/47/8295 BiPAP 14/10 30/30 days; 100% > 4 hr; av use 6 hr 21 min Leaks 95th 9.5 AHI 4.5  Allergies  Allergen Reactions   Cymbalta [Duloxetine Hcl] Other (See Comments)    Pt states it gave her bad headache   Keflex [Cephalexin]     Upset stomach   Penicillins Swelling and Rash    Swelling to throat  .Did it involve swelling of the face/tongue/throat, SOB, or low BP? Yes Did it involve sudden or severe rash/hives, skin peeling, or any reaction on the inside of your mouth or nose? Yes Did you need to seek medical attention at a hospital or doctor's office? Yes When did it last happen?      1981 If all above answers are "NO", may proceed with cephalosporin use.     Immunization History  Administered Date(s) Administered   Influenza,inj,Quad PF,6+ Mos 10/07/2021   Influenza-Unspecified 11/13/2020   Moderna Sars-Covid-2 Vaccination 10/28/2020   PFIZER(Purple Top)SARS-COV-2 Vaccination 03/04/2020, 03/25/2020    Past Medical History:  Diagnosis Date   Arthritis    LEFT KNEE PAIN AND OA; S/P LUMBAR FUSION -- SOME BACK STIFFNESS-ESP AFTER LYING DOWN   Cardiomegaly    Chronic diastolic heart failure (HCC)    History of kidney stones    Hyperlipidemia    Hypertension    Hypothyroidism    hypothyroid  Lymphedema    OSA (obstructive sleep apnea)    Osteoarthritis    Paresthesia    fingers   Pneumonia 09/29/2020   Pulmonary hypertension (HCC)     Tobacco History: Social History   Tobacco Use  Smoking Status Never  Smokeless Tobacco Never   Counseling given: Not Answered   Outpatient Medications Prior to Visit  Medication Sig Dispense Refill   acetaminophen (TYLENOL) 500 MG tablet Take 1,000 mg by mouth every 6 (six) hours as needed for moderate pain.     aspirin EC 81 MG tablet Take 1 tablet (81 mg total) by mouth daily. Swallow whole. 90 tablet 3   atorvastatin  (LIPITOR) 20 MG tablet Take 20 mg by mouth at bedtime.     calcium-vitamin D (OSCAL WITH D) 250-125 MG-UNIT tablet Take 1 tablet by mouth daily.     furosemide (LASIX) 40 MG tablet Take 40 mg by mouth 2 (two) times daily.     levothyroxine (SYNTHROID) 175 MCG tablet Take 175 mcg by mouth daily.     losartan (COZAAR) 50 MG tablet Take 50 mg by mouth daily.     Multiple Vitamin (MULTIVITAMIN WITH MINERALS) TABS tablet Take 1 tablet by mouth daily.     OXYGEN Inhale 1 L into the lungs continuous.     phenazopyridine (PYRIDIUM) 95 MG tablet Take 1 tablet (95 mg total) by mouth 3 (three) times daily as needed for pain. 12 tablet 0   polycarbophil (FIBERCON) 625 MG tablet Take 625 mg by mouth as needed for moderate constipation.     traMADol (ULTRAM) 50 MG tablet Take 1-2 tablets (50-100 mg total) by mouth every 6 (six) hours as needed. (Patient taking differently: Take 50 mg by mouth at bedtime.) 60 tablet 0   valACYclovir (VALTREX) 1000 MG tablet Take 1,000 mg by mouth as needed.     WEGOVY 2.4 MG/0.75ML SOAJ SMARTSIG:0.75 Milliliter(s) SUB-Q Once a Week (Patient not taking: Reported on 06/11/2023)     No facility-administered medications prior to visit.     Review of Systems:   Constitutional: No weight loss or gain, night sweats, fevers, chills, fatigue, or lassitude. HEENT: No headaches, difficulty swallowing, tooth/dental problems, or sore throat. No sneezing, itching, ear ache, nasal congestion, or post nasal drip CV:  No chest pain, orthopnea, PND, swelling in lower extremities, anasarca, dizziness, palpitations, syncope Resp: No shortness of breath with exertion or at rest. No excess mucus or change in color of mucus. No productive or non-productive. No hemoptysis. No wheezing.  No chest wall deformity GI:  No heartburn, indigestion GU: No dysuria, change in color of urine, urgency or frequency.   MSK:  No joint pain or swelling.  Neuro: No dizziness or lightheadedness.  Psych: No  depression or anxiety. Mood stable.     Physical Exam:  BP (!) 119/58   Pulse 66   Ht 5\' 6"  (1.676 m)   Wt (!) 358 lb 12.8 oz (162.8 kg)   LMP 03/25/2013   SpO2 96%   BMI 57.91 kg/m   GEN: Pleasant, interactive, well-appearing; morbidly obese; in no acute distress. HEENT:  Normocephalic and atraumatic. PERRLA. Sclera white. Nasal turbinates pink, moist and patent bilaterally. No rhinorrhea present. Oropharynx pink and moist, without exudate or edema. No lesions, ulcerations, or postnasal drip.  NECK:  Supple w/ fair ROM. No JVD present. Normal carotid impulses w/o bruits. Thyroid symmetrical with no goiter or nodules palpated. No lymphadenopathy.   CV: RRR, no m/r/g, no peripheral edema. Pulses intact, +  2 bilaterally. No cyanosis, pallor or clubbing. PULMONARY:  Unlabored, regular breathing. Clear bilaterally A&P w/o wheezes/rales/rhonchi. No accessory muscle use.  GI: BS present and normoactive. Soft, non-tender to palpation. No organomegaly or masses detected.  MSK: No erythema, warmth or tenderness. Cap refil <2 sec all extrem. No deformities or joint swelling noted.  Neuro: A/Ox3. No focal deficits noted.   Skin: Warm, no lesions or rashe Psych: Normal affect and behavior. Judgement and thought content appropriate.     Lab Results:  CBC    Component Value Date/Time   WBC 7.2 09/05/2021 0950   RBC 4.39 09/05/2021 0950   HGB 13.6 09/05/2021 0950   HCT 42.2 09/05/2021 0950   PLT 350 09/05/2021 0950   MCV 96.1 09/05/2021 0950   MCH 31.0 09/05/2021 0950   MCHC 32.2 09/05/2021 0950   RDW 11.8 09/05/2021 0950   LYMPHSABS 2.3 08/29/2008 0940   MONOABS 0.6 08/29/2008 0940   EOSABS 0.1 08/29/2008 0940   BASOSABS 0.0 08/29/2008 0940    BMET    Component Value Date/Time   NA 139 09/05/2021 0950   K 3.7 09/05/2021 0950   CL 97 (L) 09/05/2021 0950   CO2 34 (H) 09/05/2021 0950   GLUCOSE 112 (H) 09/05/2021 0950   BUN 20 09/05/2021 0950   CREATININE 0.87 09/05/2021 0950    CALCIUM 9.2 09/05/2021 0950   GFRNONAA >60 09/05/2021 0950   GFRAA >90 05/11/2013 0335    BNP No results found for: "BNP"   Imaging:  No results found.        No data to display          No results found for: "NITRICOXIDE"      Assessment & Plan:   Sleep apnea Severe sleep apnea, on biPAP. Excellent compliance and control on current settings.  No significant leaks. She is no longer wearing supplemental oxygen with her BiPAP. We will complete ONO on room air/BiPAP to see if we can d/c her O2 or needs to restart. Receives benefit from use. Aware of safe driving practices. Understands proper use/care of device.   Patient Instructions  Continue to use BiPAP every night, minimum of 4-6 hours a night.  Change equipment every 30 days or as directed by DME. Wash your tubing with warm soap and water daily, hang to dry. Wash humidifier portion weekly. Use bottled, distilled water - change daily Be aware of reduced alertness and do not drive or operate heavy machinery if experiencing this or drowsiness.  Exercise encouraged, as tolerated. Healthy weight management discussed.  Avoid or decrease alcohol consumption and medications that make you more sleepy, if possible. Notify if persistent daytime sleepiness occurs even with consistent use of CPAP.  Overnight oximetry - someone will contact you to schedule this.   If your oxygen study shows that you stay above 88% for most of the night, we will discontinue your oxygen   Follow up in 6 months with Dr. Craige Cotta. If symptoms worsen, please contact office for sooner follow up or seek emergency care.    Chronic respiratory failure with hypoxia (HCC) She has been off oxygen therapy for the last 6 months. No low O2 levels at home that she has witnessed. She has had significant weight loss. Walk test today on room air without desaturations. Ok to d/c portable O2. Goal >88-90%. See above.   Morbid obesity (HCC) Encouraged to continue  working on healthy weight loss measures. BMI 57.9   I spent 35 minutes of dedicated to the  care of this patient on the date of this encounter to include pre-visit review of records, face-to-face time with the patient discussing conditions above, post visit ordering of testing, clinical documentation with the electronic health record, making appropriate referrals as documented, and communicating necessary findings to members of the patients care team.  Noemi Chapel, NP 06/11/2023  Pt aware and understands NP's role.

## 2023-06-11 NOTE — Assessment & Plan Note (Signed)
Severe sleep apnea, on biPAP. Excellent compliance and control on current settings.  No significant leaks. She is no longer wearing supplemental oxygen with her BiPAP. We will complete ONO on room air/BiPAP to see if we can d/c her O2 or needs to restart. Receives benefit from use. Aware of safe driving practices. Understands proper use/care of device.   Patient Instructions  Continue to use BiPAP every night, minimum of 4-6 hours a night.  Change equipment every 30 days or as directed by DME. Wash your tubing with warm soap and water daily, hang to dry. Wash humidifier portion weekly. Use bottled, distilled water - change daily Be aware of reduced alertness and do not drive or operate heavy machinery if experiencing this or drowsiness.  Exercise encouraged, as tolerated. Healthy weight management discussed.  Avoid or decrease alcohol consumption and medications that make you more sleepy, if possible. Notify if persistent daytime sleepiness occurs even with consistent use of CPAP.  Overnight oximetry - someone will contact you to schedule this.   If your oxygen study shows that you stay above 88% for most of the night, we will discontinue your oxygen   Follow up in 6 months with Dr. Craige Cotta. If symptoms worsen, please contact office for sooner follow up or seek emergency care.

## 2023-06-11 NOTE — Progress Notes (Signed)
Reviewed and agree with assessment/plan.   Coralyn Helling, MD [facility] Pulmonary/Critical Care 06/11/2023, 1:09 PM Pager:  [phone]

## 2023-06-11 NOTE — Assessment & Plan Note (Signed)
Encouraged to continue working on healthy weight loss measures. BMI 57.9

## 2023-06-11 NOTE — Assessment & Plan Note (Signed)
She has been off oxygen therapy for the last 6 months. No low O2 levels at home that she has witnessed. She has had significant weight loss. Walk test today on room air without desaturations. Ok to d/c portable O2. Goal >88-90%. See above.

## 2023-06-18 ENCOUNTER — Telehealth: Payer: Self-pay | Admitting: Nurse Practitioner

## 2023-06-26 NOTE — Telephone Encounter (Signed)
Pt calling in for her Test result s

## 2023-06-26 NOTE — Telephone Encounter (Signed)
Spoke with patient regarding ONO result's   ONO 06/13/2023 showed she spent 70% of the study <90%. Her SpO2 low was 78% with an average of 88.4%. She needs to resume her oxygen bled through her BiPAP at night - 2 lpm and then have repeat ONO. Thanks.  Patient's voice was understanding. Nothing else further needed.

## 2023-07-06 ENCOUNTER — Encounter: Payer: Self-pay | Admitting: Nurse Practitioner

## 2023-08-07 ENCOUNTER — Telehealth: Payer: Self-pay | Admitting: Pulmonary Disease

## 2023-08-07 DIAGNOSIS — E662 Morbid (severe) obesity with alveolar hypoventilation: Secondary | ICD-10-CM

## 2023-08-07 DIAGNOSIS — G4733 Obstructive sleep apnea (adult) (pediatric): Secondary | ICD-10-CM

## 2023-08-07 NOTE — Telephone Encounter (Signed)
Pt wants to know if she can get a order from Dr. Craige Cotta to have her oxygen picked up

## 2023-08-07 NOTE — Telephone Encounter (Signed)
Per chart: Jocelyn Morris, CMA 06/26/23  4:45 PM Note Spoke with patient regarding ONO result's    ONO 06/13/2023 showed she spent 70% of the study <90%. Her SpO2 low was 78% with an average of 88.4%. She needs to resume her oxygen bled through her BiPAP at night - 2 lpm and then have repeat ONO. Thanks.   Patient's voice was understanding. Nothing else further needed.      Based on this note patient should still be on nocturnal O2 via BiPAP. Need to know why patient wants to d/c O2.  LMTCB

## 2023-08-07 NOTE — Telephone Encounter (Signed)
Pt calling back per missed call

## 2023-08-07 NOTE — Telephone Encounter (Signed)
Spoke with patient and she states she has not used nocturnal O2 in quite some time. She does not feel she needs it. She states she doesn't believe the ONO results "because it kept coming off my finger and it hurt". She states she pays >$140 per month for the O2 and she does not want to continue to pay this. She is requesting to d/c the O2, she is aware of the recent recommendations. She would like to know if there is another test she can do, with the BiPAP and O2 on that may give better results.   Please advise, thank you!

## 2023-08-07 NOTE — Telephone Encounter (Signed)
Option would be to arrange for a Bipap titration study in the sleep lab.  If she is agreeable to this, then please put in an order to arrange for a Bipap titration study starting with Bipap 12/8 cm H2O.

## 2023-08-14 NOTE — Telephone Encounter (Signed)
Spoke with patient and advised Dr Craige Cotta recommendation.  She is agreeable and order placed.

## 2023-11-17 ENCOUNTER — Telehealth: Payer: Self-pay | Admitting: Cardiology

## 2023-11-17 NOTE — Progress Notes (Unsigned)
Cardiology Office Note:   Date:  11/19/2023  ID:  Jocelyn SINGELTON, DOB 1961/02/28, MRN [id number] PCP: Juliette Alcide, MD  [facility] Providers Cardiologist:  [name], MD {  History of Present Illness:   Jocelyn Morris is a 62 y.o. female who presents for evaluation of pulmonary HTN, diastolic HF.  She was at [facility] in 2021.   She was admitted for AKI with and respiratory failure with acute on chronic diastolic dysfunction.  She had a CT without PE.  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  She desaturated overnight and was thought to have sleep apnea.  She was also found to have a DVT.  She was discharged to rehab on O2.   Of note she was told to stop Ambien and nabumetone at discharge.  Eliquis and Lasix BID were added at discharge.    She was sent home on 2 liters of O2.        Since I last saw her she called to be added to my schedule because she had some palpitations.  She describes her heart racing overnight.  She had very anxious.  She took her pulse up in her neck and then looked at her watch and her heart rate was about 118.  Her to go down to the 70s.  I did review her phone data and she has been having some fluctuating heart rates.  She cannot record an EKG on wearable's.  This happened while she was at rest.  She did not have presyncope or syncope.  She did not describe chest pressure, neck or arm discomfort.  She has had a little weight gain and a little more edema.  She actually took a couple days of increased diuretic.  She is not describing new PND or orthopnea.  She wears her BiPAP at night but has not required oxygen.  She thought she had been doing pretty well up to this point.  ROS: As stated in the HPI and negative for all other systems.  Studies Reviewed:    EKG:   EKG Interpretation Date/Time:  Thursday November 19 2023 08:42:28  EST Ventricular Rate:  67 PR Interval:  172 QRS Duration:  98 QT Interval:  410 QTC Calculation: 433 R Axis:   13  Text Interpretation: Normal sinus rhythm Poor anterior R wave progression When compared with ECG of 29-Apr-2013 15:32, Nonspecific T wave abnormality no longer evident in Inferior leads Confirmed by [name] ([id number]) on 11/19/2023 8:58:42 AM    Risk Assessment/Calculations:              Physical Exam:   VS:  BP 114/72 (BP Location: Right Arm, Patient Position: Sitting, Cuff Size: Large)   Pulse 67   Ht 5\' 6"  (1.676 m)   Wt (!) 361 lb 9.6 oz (164 kg)   LMP 03/25/2013   SpO2 96%   BMI 58.36 kg/m    Wt Readings from Last 3 Encounters:  11/19/23 (!) 361 lb 9.6 oz (164 kg)  06/11/23 (!) 358 lb 12.8 oz (162.8 kg)  02/26/23 (!) 350 lb (158.8 kg)     GEN: Well nourished, well developed in no acute distress NECK: No JVD; No carotid bruits CARDIAC: RRR, 3/6 right upper sternal border systolic murmur, no diastolic murmurs, rubs, gallops RESPIRATORY:  Clear to auscultation without rales, wheezing or rhonchi  ABDOMEN: Soft, non-tender, non-distended EXTREMITIES:  No edema; No deformity   ASSESSMENT AND PLAN:   PALPITATIONS:   I am going to have her wear a 2-week monitor.  Further management will be based on these results.  SLEEP APNEA:     She uses BiPAP.  No change in therapy.  MORBID OBESITY:   She is working still on weight loss.  No change in therapy.   CHRONIC DIASTOLIC HF:      She had a little extra volume and weight gain and I am going to give her 2 days of Zaroxolyn.  2.5 mg daily.  I will check her basic metabolic profile today.   DVT: She is on appropriate therapy.    HYPOVENTILATION OBESITY SYNDROME:   She did have some mild aortic sclerosis on her last echo.  However, her right ventricle had normal pulmonary artery systolic pressures and normal size.  Patient looked okay.  No change in therapy other than above.  RISK REDUCTION: Her family has  tested positive for elevated LP(a) and I will order this on her.       Follow up with me in March  Signed, [name], MD

## 2023-11-17 NOTE — Telephone Encounter (Signed)
Spoke to patient who reports she has been having palpitations/ flutter over the last week or 2.She stated her HR is 69-118. She states she feels jittery at times but deny any other symptoms at this time. Per the patient her Fitbit has notified her of irregular heart beats. She reports her BP has been normal. She also report weight gain of 8 lbs since Friday. She was advised to increase her Lasix X1 today per protocol. She is scheduled for an office visit. 11/21 with Dr Antoine Poche. Advised if symptoms worse or new symptoms develop to be seen in the ED. Patient verbalized understanding and agree.

## 2023-11-17 NOTE — Telephone Encounter (Signed)
Patient c/o Palpitations:  High priority if patient c/o lightheadedness, shortness of breath, or chest pain  How long have you had palpitations/irregular HR/ Afib? Are you having the symptoms now? Pt noticed last night, but fitbit app shows it's been going on for a week now.   Are you currently experiencing lightheadedness, SOB or CP? Lightheadedness and SOB slightly last night   Do you have a history of afib (atrial fibrillation) or irregular heart rhythm? Yes   Have you checked your BP or HR? (document readings if available): 69 BPM to 116 BPM last night going between the two numbers   Are you experiencing any other symptoms? States she has gained 9 lbs since 11/15.

## 2023-11-19 ENCOUNTER — Encounter: Payer: Self-pay | Admitting: Cardiology

## 2023-11-19 ENCOUNTER — Ambulatory Visit (INDEPENDENT_AMBULATORY_CARE_PROVIDER_SITE_OTHER): Payer: BC Managed Care – PPO

## 2023-11-19 ENCOUNTER — Ambulatory Visit: Payer: BC Managed Care – PPO | Attending: Cardiology | Admitting: Cardiology

## 2023-11-19 VITALS — BP 114/72 | HR 67 | Ht 66.0 in | Wt 361.6 lb

## 2023-11-19 DIAGNOSIS — R002 Palpitations: Secondary | ICD-10-CM

## 2023-11-19 DIAGNOSIS — I5032 Chronic diastolic (congestive) heart failure: Secondary | ICD-10-CM

## 2023-11-19 MED ORDER — METOLAZONE 2.5 MG PO TABS: 2.5000 mg | ORAL_TABLET | Freq: Every day | ORAL | 0 refills | Status: AC

## 2023-11-19 NOTE — Patient Instructions (Addendum)
Medication Instructions:  Zaroxolone 2.5 mg daily for 2 days only. Script sent. *If you need a refill on your cardiac medications before your next appointment, please call your pharmacy*   Lab Work: Lpa and BMET today. If you have labs (blood work) drawn today and your tests are completely normal, you will receive your results only by: MyChart Message (if you have MyChart) OR A paper copy in the mail If you have any lab test that is abnormal or we need to change your treatment, we will call you to review the results.   Testing/Procedures: Christena Deem- Long Term Monitor Instructions  Your physician has requested you wear a ZIO patch monitor for 14 days.  This is a single patch monitor. [employer] supplies one patch monitor per enrollment. Additional stickers are not available. Please do not apply patch if you will be having a Nuclear Stress Test,  Echocardiogram, Cardiac CT, MRI, or Chest Xray during the period you would be wearing the  monitor. The patch cannot be worn during these tests. You cannot remove and re-apply the  ZIO XT patch monitor.  Your ZIO patch monitor will be mailed 3 day USPS to your address on file. It may take 3-5 days  to receive your monitor after you have been enrolled.  Once you have received your monitor, please review the enclosed instructions. Your monitor  has already been registered assigning a specific monitor serial # to you.  Billing and Patient Assistance Program Information  We have supplied [employer] with any of your insurance information on file for billing purposes. [employer] offers a sliding scale Patient Assistance Program for patients that do not have  insurance, or whose insurance does not completely cover the cost of the ZIO monitor.  You must apply for the Patient Assistance Program to qualify for this discounted rate.  To apply, please call [employer] at [phone], select option 4, select option 2, ask to apply for  Patient Assistance Program.  Meredeth Ide will ask your household income, and how many people  are in your household. They will quote your out-of-pocket cost based on that information.  [employer] will also be able to set up a 42-month, interest-free payment plan if needed.  Applying the monitor   Shave hair from upper left chest.  Hold abrader disc by orange tab. Rub abrader in 40 strokes over the upper left chest as  indicated in your monitor instructions.  Clean area with 4 enclosed alcohol pads. Let dry.  Apply patch as indicated in monitor instructions. Patch will be placed under collarbone on left  side of chest with arrow pointing upward.  Rub patch adhesive wings for 2 minutes. Remove white label marked "1". Remove the white  label marked "2". Rub patch adhesive wings for 2 additional minutes.  While looking in a mirror, press and release button in center of patch. A small green light will  flash 3-4 times. This will be your only indicator that the monitor has been turned on.  Do not shower for the first 24 hours. You may shower after the first 24 hours.  Press the button if you feel a symptom. You will hear a small click. Record Date, Time and  Symptom in the Patient Logbook.  When you are ready to remove the patch, follow instructions on the last 2 pages of Patient  Logbook. Stick patch monitor onto the last page of Patient Logbook.  Place Patient Logbook in the blue and white box. Use locking tab on box and  tape box closed  securely. The blue and white box has prepaid postage on it. Please place it in the mailbox as  soon as possible. Your physician should have your test results approximately 7 days after the  monitor has been mailed back to [facility].  Call [facility] Customer Care at [phone] if you have questions regarding  your ZIO XT patch monitor. Call them immediately if you see an orange light blinking on your  monitor.  If your monitor falls off in less than 4 days, contact our Monitor  department at [phone].  If your monitor becomes loose or falls off after 4 days call [employer] at [phone] for  suggestions on securing your monitor    Follow-Up: At [facility], you and your health needs are our priority.  As part of our continuing mission to provide you with exceptional heart care, we have created designated Provider Care Teams.  These Care Teams include your primary Cardiologist (physician) and Advanced Practice Providers (APPs -  Physician Assistants and Nurse Practitioners) who all work together to provide you with the care you need, when you need it.  We recommend signing up for the patient portal called "MyChart".  Sign up information is provided on this After Visit Summary.  MyChart is used to connect with patients for Virtual Visits (Telemedicine).  Patients are able to view lab/test results, encounter notes, upcoming appointments, etc.  Non-urgent messages can be sent to your provider as well.   To learn more about what you can do with MyChart, go to [email or web address].    Your next appointment:   3 months  Provider:   An APP

## 2023-11-19 NOTE — Progress Notes (Unsigned)
Enrolled patient for a 14 day Zio XT  monitor to be mailed to patients home  °

## 2023-11-21 LAB — BASIC METABOLIC PANEL
BUN/Creatinine Ratio: 27 (ref 12–28)
BUN: 22 mg/dL (ref 8–27)
CO2: 29 mmol/L (ref 20–29)
Calcium: 9.5 mg/dL (ref 8.7–10.3)
Chloride: 97 mmol/L (ref 96–106)
Creatinine, Ser: 0.83 mg/dL (ref 0.57–1.00)
Glucose: 93 mg/dL (ref 70–99)
Potassium: 4.5 mmol/L (ref 3.5–5.2)
Sodium: 141 mmol/L (ref 134–144)
eGFR: 80 mL/min/{1.73_m2} (ref >59)

## 2023-11-21 LAB — LIPOPROTEIN A (LPA): Lipoprotein (a): 398.3 nmol/L — ABNORMAL HIGH (ref <75.0)

## 2023-11-22 DIAGNOSIS — R002 Palpitations: Secondary | ICD-10-CM | POA: Diagnosis not present

## 2023-11-23 ENCOUNTER — Encounter: Payer: Self-pay | Admitting: *Deleted

## 2023-12-16 ENCOUNTER — Telehealth: Payer: Self-pay | Admitting: Nurse Practitioner

## 2023-12-16 DIAGNOSIS — G4733 Obstructive sleep apnea (adult) (pediatric): Secondary | ICD-10-CM

## 2023-12-16 NOTE — Telephone Encounter (Signed)
Patient would like to discontinue her use of oxygen. She has not used it since January and is being charged $100 a month for this. No one has reached out to patient about having a bipap study yet in reference to last encounter. Patient doesn't use the big machine or tank that is in her home. She still has a portable oxygen tank if it is ever needed but her other doctors state that she is fine without it.

## 2023-12-17 NOTE — Telephone Encounter (Signed)
Patient checking on message for discontinuing oxygen. DME company is [employer]. Patient phone number is [phone].

## 2023-12-18 ENCOUNTER — Encounter: Payer: Self-pay | Admitting: *Deleted

## 2023-12-24 NOTE — Telephone Encounter (Signed)
Called and spoke to patient.  She is requesting order to d/c oxygen. She has not used it since Jan and has to pay 100 dollars monthly. She was prescribed O2 during admission for PNA. She has weaned herself off of it.  She does wear bipap nightly.   Katie, please advise. Thanks Pt is aware that Florentina Addison is out of the office until 12/31

## 2023-12-31 NOTE — Telephone Encounter (Signed)
 ONO is scanned in under media. Dated 06/14/2023.

## 2023-12-31 NOTE — Telephone Encounter (Signed)
 Called pt about insurance for BIPAP titration   Pt states she does not want to have in lab sleep study at all  Nothing further needed

## 2023-12-31 NOTE — Telephone Encounter (Signed)
 Patient has called back to see if this issue has been addressed.ibuprofen informed her the  message had been read and would be taken care of.  She voiced her understanding.

## 2023-12-31 NOTE — Telephone Encounter (Signed)
 Note found from when Dr. Shellia reviewed her ONO.   ONO 06/13/2023 showed she spent 70% of the study <90%. Her SpO2 low was 78% with an average of 88.4%. She needs to resume her oxygen  bled through her BiPAP at night - 2 lpm and then have repeat ONO. Thanks Pt did not feel study was accurate  Dr. Magdaleno recommendation was to undergo BiPAP titration study to evaluate for hypoxia and need to continue O2 bled through BiPAP. This order was placed in June. Does not appear she ever completed this. Can we follow up with pt and PCCs on this? Thanks.

## 2023-12-31 NOTE — Telephone Encounter (Signed)
 I ordered an ONO at her visit in June to be done on BiPAP/room air. Was this ever completed? This is what we were waiting on to d/c her home concentrator for supplemental O2. She had a walk test in office and did not have any desaturations on room air with exertion.

## 2023-12-31 NOTE — Telephone Encounter (Signed)
 Spoke to patient and relayed below message and recommendations. Pt voiced concerns with paying for oxygen  and not using it. I explained need for bipap titration as ONO showed that she had desaturations during sleep. Pt does not recall having bipap titration study. She does agree with proceeding with sleep. Order placed under Katie, as Dr. Shellia is no longer with our practice.  Pt is aware that we can not place an order to d/c oxygen  until test has been completed.  She became upset and stated that we are making her pay for something that she has not used in a year. I again explained why we could not d/c order, as ONO showed that she still needed O2. We talked about contacting DME and requesting to sign a AMA form if she wanted the oxygen  d/c'd.   Routing to [employer] as an FINANCIAL [employer]NER.

## 2024-03-02 NOTE — Progress Notes (Unsigned)
 Cardiology Office Note:   Date:  03/03/2024  ID:  Jocelyn Morris, DOB 09-10-1961, MRN [id number] PCP: Juliette Alcide, MD  [facility] Providers Cardiologist:  [name], MD {  History of Present Illness:   Jocelyn Morris is a 63 y.o. female who presents for evaluation of pulmonary HTN, diastolic HF.  She was at [facility] in 2021.   She was admitted for AKI with and respiratory failure with acute on chronic diastolic dysfunction.  She had a CT without PE.  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  She desaturated overnight and was thought to have sleep apnea.  She was also found to have a DVT.  She was discharged to rehab on O2.   Of note she was told to stop Ambien and nabumetone at discharge.  Eliquis and Lasix BID were added at discharge.    She was sent home on 2 liters of O2.        At the last visit she had palpitations.  I ordered a monitor and she had rare SVT.  She continues to get occasional palpitations but these are not particularly bothersome.  The patient denies any new symptoms such as chest discomfort, neck or arm discomfort. There has been no new shortness of breath, PND or orthopnea. There have been no reported palpitations, presyncope or syncope.   ROS: Insomnia.  Otherwise as stated in the HPI negative for all other systems.  Studies Reviewed:    EKG:   EKG Interpretation Date/Time:  Thursday March 03 2024 11:58:46 EST Ventricular Rate:  63 PR Interval:  186 QRS Duration:  94 QT Interval:  428 QTC Calculation: 437 R Axis:   3  Text Interpretation: Normal sinus rhythm Minimal voltage criteria for LVH, may be normal variant ( R in aVL ) Cannot rule out Anterior infarct (cited on or before 03-Mar-2024) When compared with ECG of 19-Nov-2023 08:42, No significant change was found Confirmed by [name] ([id number]) on 03/03/2024 1:07:28 PM     Risk Assessment/Calculations:              Physical Exam:   VS:  BP 124/72 (BP Location: Left Arm, Patient Position: Sitting)   Pulse 63   Ht 5\' 6"  (1.676 m)   Wt (!) 373 lb 6.4 oz (169.4 kg)   LMP 03/25/2013   BMI 60.27 kg/m    Wt Readings from Last 3 Encounters:  03/03/24 (!) 373 lb 6.4 oz (169.4 kg)  11/19/23 (!) 361 lb 9.6 oz (164 kg)  06/11/23 (!) 358 lb 12.8 oz (162.8 kg)     GEN: Well nourished, well developed in no acute distress NECK: No JVD; No carotid bruits CARDIAC: RRR, no murmurs, rubs, gallops RESPIRATORY:  Clear to auscultation without rales, wheezing or rhonchi  ABDOMEN: Soft, non-tender, non-distended EXTREMITIES:  No edema; No deformity   ASSESSMENT AND PLAN:   PALPITATIONS:   These have not been particular problematic.  No change in therapy.   SLEEP APNEA:    She uses her BiPAP.  No change in therapy.    MORBID OBESITY: She has been educated about this previously.   CHRONIC DIASTOLIC HF:     She seems to be euvolemic.  She is watching her salt and fluid.  I previously given her some Zaroxolyn but I want to hold off on doing this if she can manage this with salt  and fluid restriction.   DVT: She is on appropriate therapy.     RISK REDUCTION: She had a markedly elevated LPa.  I am going to increase her Lipitor to 40 mg daily.  Hopefully in the future we will have more targeted therapies.       Follow up with me in 1 year  Signed, [name], MD

## 2024-03-03 ENCOUNTER — Ambulatory Visit: Payer: BC Managed Care – PPO | Attending: Cardiology | Admitting: Cardiology

## 2024-03-03 ENCOUNTER — Encounter: Payer: Self-pay | Admitting: Cardiology

## 2024-03-03 VITALS — BP 124/72 | HR 63 | Ht 66.0 in | Wt 373.4 lb

## 2024-03-03 DIAGNOSIS — I5032 Chronic diastolic (congestive) heart failure: Secondary | ICD-10-CM | POA: Diagnosis not present

## 2024-03-03 DIAGNOSIS — E662 Morbid (severe) obesity with alveolar hypoventilation: Secondary | ICD-10-CM

## 2024-03-03 DIAGNOSIS — R002 Palpitations: Secondary | ICD-10-CM

## 2024-03-03 DIAGNOSIS — Z136 Encounter for screening for cardiovascular disorders: Secondary | ICD-10-CM

## 2024-03-03 MED ORDER — ATORVASTATIN CALCIUM 40 MG PO TABS: 40.0000 mg | ORAL_TABLET | Freq: Every day | ORAL | 3 refills | Status: AC

## 2024-03-03 NOTE — Patient Instructions (Signed)
 Medication Instructions:  Increase lipitor to 40 mg by mouth daily. New script sent. *If you need a refill on your cardiac medications before your next appointment, please call your pharmacy*   Follow-Up: At [facility], you and your health needs are our priority.  As part of our continuing mission to provide you with exceptional heart care, we have created designated Provider Care Teams.  These Care Teams include your primary Cardiologist (physician) and Advanced Practice Providers (APPs -  Physician Assistants and Nurse Practitioners) who all work together to provide you with the care you need, when you need it.  We recommend signing up for the patient portal called "MyChart".  Sign up information is provided on this After Visit Summary.  MyChart is used to connect with patients for Virtual Visits (Telemedicine).  Patients are able to view lab/test results, encounter notes, upcoming appointments, etc.  Non-urgent messages can be sent to your provider as well.   To learn more about what you can do with MyChart, go to [email or web address].    Your next appointment:   1 year(s)  Provider:   [name], MD     Other Instructions
